# Patient Record
Sex: Female | Born: 1985 | Race: White | Hispanic: No | Marital: Married | Smoking: Current every day smoker
Health system: Southern US, Community
[De-identification: ages and names within clinical notes are randomized; demographics above are authoritative.]

## PROBLEM LIST (undated history)

## (undated) ENCOUNTER — Inpatient Hospital Stay (HOSPITAL_COMMUNITY): Payer: Self-pay

## (undated) DIAGNOSIS — N83209 Unspecified ovarian cyst, unspecified side: Secondary | ICD-10-CM

## (undated) DIAGNOSIS — F32A Depression, unspecified: Secondary | ICD-10-CM

## (undated) DIAGNOSIS — R87629 Unspecified abnormal cytological findings in specimens from vagina: Secondary | ICD-10-CM

## (undated) DIAGNOSIS — F329 Major depressive disorder, single episode, unspecified: Secondary | ICD-10-CM

## (undated) DIAGNOSIS — F1410 Cocaine abuse, uncomplicated: Secondary | ICD-10-CM

## (undated) HISTORY — PX: WISDOM TOOTH EXTRACTION: SHX21

## (undated) HISTORY — PX: CERVICAL CONE BIOPSY: SUR198

---
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
## 2006-07-30 ENCOUNTER — Emergency Department (HOSPITAL_COMMUNITY): Admission: EM | Admit: 2006-07-30 | Discharge: 2006-07-30 | Payer: Self-pay | Admitting: Emergency Medicine

## 2006-09-20 ENCOUNTER — Emergency Department (HOSPITAL_COMMUNITY): Admission: EM | Admit: 2006-09-20 | Discharge: 2006-09-20 | Payer: Self-pay | Admitting: Emergency Medicine

## 2007-01-12 ENCOUNTER — Emergency Department (HOSPITAL_COMMUNITY): Admission: EM | Admit: 2007-01-12 | Discharge: 2007-01-13 | Payer: Self-pay | Admitting: Emergency Medicine

## 2007-01-21 ENCOUNTER — Emergency Department (HOSPITAL_COMMUNITY): Admission: EM | Admit: 2007-01-21 | Discharge: 2007-01-21 | Payer: Self-pay | Admitting: Emergency Medicine

## 2011-01-24 ENCOUNTER — Other Ambulatory Visit (HOSPITAL_COMMUNITY): Payer: Self-pay | Admitting: Preventative Medicine

## 2011-01-24 ENCOUNTER — Emergency Department (HOSPITAL_COMMUNITY)
Admission: EM | Admit: 2011-01-24 | Discharge: 2011-01-24 | Disposition: A | Discharge status: Home / Self Care | Payer: Self-pay | Attending: Emergency Medicine | Admitting: Emergency Medicine

## 2011-01-24 DIAGNOSIS — M25569 Pain in unspecified knee: Secondary | ICD-10-CM

## 2011-01-25 ENCOUNTER — Inpatient Hospital Stay (HOSPITAL_COMMUNITY): Admission: RE | Admit: 2011-01-25 | Payer: Self-pay | Source: Ambulatory Visit

## 2011-05-11 ENCOUNTER — Encounter: Payer: Self-pay | Admitting: *Deleted

## 2011-05-11 ENCOUNTER — Emergency Department (HOSPITAL_COMMUNITY)
Admission: EM | Admit: 2011-05-11 | Discharge: 2011-05-11 | Disposition: A | Discharge status: Home / Self Care | Payer: Self-pay | Attending: Emergency Medicine | Admitting: Emergency Medicine

## 2011-05-11 DIAGNOSIS — F17200 Nicotine dependence, unspecified, uncomplicated: Secondary | ICD-10-CM | POA: Insufficient documentation

## 2011-05-11 DIAGNOSIS — IMO0002 Reserved for concepts with insufficient information to code with codable children: Secondary | ICD-10-CM | POA: Insufficient documentation

## 2011-05-11 DIAGNOSIS — L0291 Cutaneous abscess, unspecified: Secondary | ICD-10-CM

## 2011-05-11 MED ORDER — OXYCODONE-ACETAMINOPHEN 5-325 MG PO TABS
1.0000 | ORAL_TABLET | Freq: Once | ORAL | Status: AC
  Administered 2011-05-11: 1 via ORAL
  Filled 2011-05-11: qty 1

## 2011-05-11 MED ORDER — LIDOCAINE-EPINEPHRINE (PF) 1 %-1:200000 IJ SOLN
INTRAMUSCULAR | Status: AC
  Administered 2011-05-11: 23:00:00
  Filled 2011-05-11: qty 10

## 2011-05-11 MED ORDER — DOXYCYCLINE HYCLATE 100 MG PO CAPS: 100.0000 mg | ORAL_CAPSULE | Freq: Two times a day (BID) | ORAL | Status: AC

## 2011-05-11 MED ORDER — HYDROCODONE-ACETAMINOPHEN 5-325 MG PO TABS: 2.0000 | ORAL_TABLET | ORAL | Status: AC | PRN

## 2011-05-11 NOTE — ED Provider Notes (Signed)
History     CSN: [id number] Arrival date & time: 05/11/2011 10:20 PM  Chief Complaint  Patient presents with  . Recurrent Skin Infections   HPI Comments: Patient states she's noticed a swelling in the right axilla. She also noticed that she has some pain  going to her right breast area. The pain increases with palpation. She denies any fever  Patient is a 25 y.o. female presenting with abscess. The history is provided by the patient.  Abscess  This is a new problem. Episode frequency: 3 days ago. The problem has been gradually worsening. Affected Location: right axilla. The problem is mild. The abscess is characterized by draining. It is unknown what she was exposed to. Pertinent negatives include no anorexia and no fever. There were no sick contacts. She has received no recent medical care.    History reviewed. No pertinent past medical history.  History reviewed. No pertinent past surgical history.  History reviewed. No pertinent family history.  History  Substance Use Topics  . Smoking status: Current Everyday Smoker -- 1.0 packs/day  . Smokeless tobacco: Not on file  . Alcohol Use:      occassional aclohol intake    OB History    Grav Para Term Preterm Abortions TAB SAB Ect Mult Living                  Review of Systems  Constitutional: Negative for fever.  Gastrointestinal: Negative for anorexia.  All other systems reviewed and are negative.    Physical Exam  BP 111/72  Pulse 74  Temp(Src) 98.1 F (36.7 C) (Oral)  Resp 20  Ht 5\' 7"  (1.702 m)  Wt 175 lb (79.379 kg)  BMI 27.41 kg/m2  SpO2 100%  LMP 05/04/2011  Physical Exam  Vitals reviewed. Constitutional: She appears well-developed and well-nourished. No distress.  HENT:  Head: Normocephalic and atraumatic.  Right Ear: External ear normal.  Left Ear: External ear normal.  Eyes: Conjunctivae are normal. Right eye exhibits no discharge. Left eye exhibits no discharge. No scleral icterus.  Neck: Neck  supple. No tracheal deviation present.  Cardiovascular: Normal rate, regular rhythm and intact distal pulses.   Pulmonary/Chest: Effort normal and breath sounds normal. No stridor. No respiratory distress. She has no wheezes. She has no rales.  Abdominal: Soft. Bowel sounds are normal. She exhibits no distension. There is no tenderness. There is no rebound and no guarding.  Musculoskeletal: She exhibits tenderness. She exhibits no edema.  Neurological: She is alert. She has normal strength. No sensory deficit. Cranial nerve deficit:  no gross defecits noted. She exhibits normal muscle tone. She displays no seizure activity. Coordination normal.  Skin: Skin is warm and dry. No rash noted.  Psychiatric: She has a normal mood and affect.    ED Course  INCISION AND DRAINAGE Performed by: Linwood Dibbles R Authorized by: Linwood Dibbles R Consent: Verbal consent obtained. Risks and benefits: risks, benefits and alternatives were discussed Consent given by: patient Patient understanding: patient states understanding of the procedure being performed Type: abscess Location: Right axilla. Anesthesia: local infiltration Local anesthetic: lidocaine 1% with epinephrine Anesthetic total: 4 ml Scalpel size: 11 Incision type: single straight Complexity: simple Drainage characteristics: None. Drainage amount: scant Patient tolerance: Patient tolerated the procedure well with no immediate complications.   . MDM Patient was a small area of induration. No definite abscess drainage. We'll start her on oral antibiotics pain medications and will have her continue the warm compresses.  Celene Kras, MD 05/11/11 [phone]

## 2011-05-11 NOTE — ED Notes (Signed)
Boil under right arm, states it opened up and drained a couple of nights ago

## 2011-06-24 ENCOUNTER — Emergency Department (HOSPITAL_COMMUNITY)
Admission: EM | Admit: 2011-06-24 | Discharge: 2011-06-24 | Disposition: A | Discharge status: Home / Self Care | Payer: Self-pay | Attending: Emergency Medicine | Admitting: Emergency Medicine

## 2011-06-24 ENCOUNTER — Encounter (HOSPITAL_COMMUNITY): Payer: Self-pay | Admitting: *Deleted

## 2011-06-24 DIAGNOSIS — R6883 Chills (without fever): Secondary | ICD-10-CM | POA: Insufficient documentation

## 2011-06-24 DIAGNOSIS — R062 Wheezing: Secondary | ICD-10-CM | POA: Insufficient documentation

## 2011-06-24 DIAGNOSIS — J40 Bronchitis, not specified as acute or chronic: Secondary | ICD-10-CM

## 2011-06-24 DIAGNOSIS — J3489 Other specified disorders of nose and nasal sinuses: Secondary | ICD-10-CM | POA: Insufficient documentation

## 2011-06-24 DIAGNOSIS — F17200 Nicotine dependence, unspecified, uncomplicated: Secondary | ICD-10-CM | POA: Insufficient documentation

## 2011-06-24 DIAGNOSIS — R05 Cough: Secondary | ICD-10-CM | POA: Insufficient documentation

## 2011-06-24 DIAGNOSIS — R059 Cough, unspecified: Secondary | ICD-10-CM | POA: Insufficient documentation

## 2011-06-24 DIAGNOSIS — IMO0001 Reserved for inherently not codable concepts without codable children: Secondary | ICD-10-CM | POA: Insufficient documentation

## 2011-06-24 MED ORDER — GUAIFENESIN-CODEINE 100-10 MG/5ML PO SYRP: 10.0000 mL | ORAL_SOLUTION | Freq: Three times a day (TID) | ORAL | Status: AC | PRN

## 2011-06-24 MED ORDER — ALBUTEROL SULFATE HFA 108 (90 BASE) MCG/ACT IN AERS
2.0000 | INHALATION_SPRAY | Freq: Once | RESPIRATORY_TRACT | Status: AC
  Administered 2011-06-24: 2 via RESPIRATORY_TRACT
  Filled 2011-06-24: qty 6.7

## 2011-06-24 MED ORDER — IPRATROPIUM BROMIDE 0.02 % IN SOLN
0.5000 mg | Freq: Once | RESPIRATORY_TRACT | Status: AC
  Administered 2011-06-24: 0.5 mg via RESPIRATORY_TRACT
  Filled 2011-06-24: qty 2.5

## 2011-06-24 MED ORDER — ALBUTEROL SULFATE (5 MG/ML) 0.5% IN NEBU
5.0000 mg | INHALATION_SOLUTION | Freq: Once | RESPIRATORY_TRACT | Status: AC
  Administered 2011-06-24: 5 mg via RESPIRATORY_TRACT
  Filled 2011-06-24: qty 1

## 2011-06-24 MED ORDER — DOXYCYCLINE HYCLATE 100 MG PO CAPS: 100.0000 mg | ORAL_CAPSULE | Freq: Two times a day (BID) | ORAL | Status: AC

## 2011-06-24 MED ORDER — DOXYCYCLINE HYCLATE 100 MG PO TABS
100.0000 mg | ORAL_TABLET | Freq: Once | ORAL | Status: AC
  Administered 2011-06-24: 100 mg via ORAL
  Filled 2011-06-24: qty 1

## 2011-06-24 NOTE — ED Notes (Signed)
Pt c/o cough, headache, fever and body aches x 2 weeks

## 2011-06-24 NOTE — ED Notes (Signed)
Pt a/ox4. Resp even and unlabored. NAD at this time. D/C instructions and RX x 2 reviewed with pt. Pt verbalized understanding. Pt ambulated to POV with steady gate. Boyfriend with pt to transport home.

## 2011-06-24 NOTE — ED Provider Notes (Signed)
History     CSN: [id number] Arrival date & time: 06/24/2011 12:41 PM  Chief Complaint  Patient presents with  . Nasal Congestion  . Generalized Body Aches  . Fever  . Cough  . Headache    (Consider location/radiation/quality/duration/timing/severity/associated sxs/prior treatment) Patient is a 25 y.o. female presenting with cough. The history is provided by the patient.  Cough This is a new problem. The current episode started more than 1 week ago. The problem occurs every few minutes. The problem has not changed since onset.The cough is non-productive. The maximum temperature recorded prior to her arrival was 100 to 100.9 F. Associated symptoms include chills, rhinorrhea, myalgias and wheezing. Pertinent negatives include no chest pain, no ear congestion, no ear pain, no headaches, no sore throat, no shortness of breath and no eye redness. She has tried decongestants for the symptoms. The treatment provided no relief. She is a smoker. Her past medical history does not include bronchitis, pneumonia or asthma.    History reviewed. No pertinent past medical history.  History reviewed. No pertinent past surgical history.  History reviewed. No pertinent family history.  History  Substance Use Topics  . Smoking status: Current Everyday Smoker -- 1.0 packs/day  . Smokeless tobacco: Not on file  . Alcohol Use: Yes     occassional aclohol intake    OB History    Grav Para Term Preterm Abortions TAB SAB Ect Mult Living                  Review of Systems  Constitutional: Positive for chills.  HENT: Positive for congestion and rhinorrhea. Negative for ear pain, sore throat, neck pain and neck stiffness.   Eyes: Negative for discharge, redness and visual disturbance.  Respiratory: Positive for cough and wheezing. Negative for shortness of breath.   Cardiovascular: Negative for chest pain.  Musculoskeletal: Positive for myalgias.  Neurological: Negative for dizziness, weakness,  numbness and headaches.  Hematological: Does not bruise/bleed easily.  All other systems reviewed and are negative.    Allergies  Other  Home Medications   Current Outpatient Rx  Name Route Sig Dispense Refill  . ACETAMINOPHEN 650 MG PO TBCR Oral Take 650 mg by mouth every 8 (eight) hours as needed. For pain     . NYQUIL COLD & FLU PO Oral Take 1 capsule by mouth daily as needed. FOR COUGH AND COLD SYMPTOMS     . GUAIFENESIN 600 MG PO TB12 Oral Take 1,200 mg by mouth 2 (two) times daily.      . IBUPROFEN 200 MG PO TABS Oral Take 800 mg by mouth 3 (three) times daily as needed. For pain     . PHENYLEPH-CPM-DM-APAP 01-25-09-325 MG PO CAPS Oral Take 1 capsule by mouth daily as needed. For pain     . LEVONORGESTREL 20 MCG/24HR IU IUD Intrauterine 1 each by Intrauterine route once.        BP 115/80  Pulse 75  Temp(Src) 98.1 F (36.7 C) (Oral)  Resp 18  Ht 5\' 7"  (1.702 m)  Wt 175 lb (79.379 kg)  BMI 27.41 kg/m2  SpO2 97%  Physical Exam  Nursing note and vitals reviewed. Constitutional: She appears well-developed and well-nourished. No distress.  HENT:  Head: Normocephalic and atraumatic. No trismus in the jaw.  Nose: Mucosal edema and rhinorrhea present. No sinus tenderness.  Mouth/Throat: Uvula is midline, oropharynx is clear and moist and mucous membranes are normal. No uvula swelling.  Eyes: EOM are normal. Pupils are equal,  round, and reactive to light.  Neck: Normal range of motion. Neck supple.  Cardiovascular: Normal rate, regular rhythm and normal heart sounds.   Pulmonary/Chest: Effort normal. She has no decreased breath sounds. She has wheezes. She has no rales. She exhibits no tenderness.  Musculoskeletal: Normal range of motion.  Lymphadenopathy:    She has no cervical adenopathy.  Neurological: She is alert. No cranial nerve deficit. She exhibits normal muscle tone. Coordination normal.  Skin: Skin is warm and dry.    ED Course  Procedures (including critical  care time)       MDM     3:42 PM patient feeling better.  Few expiratory wheezes still remain.  No rales, hypoxia, tachycardia or tachypnea.  Likley bronchitis.  I will start doxycycline and disp an inhaler for home use.  Patient agrees to return here for worsening sx's  Medical screening examination/treatment/procedure(s) were performed by non-physician practitioner and as supervising physician I was immediately available for consultation/collaboration. Osvaldo Human, M.D.   Tammy L. [place], [place] 06/26/11 1854  Carleene Cooper III, MD 06/26/11 [phone]

## 2015-09-27 NOTE — L&D Delivery Note (Signed)
Delivery Note  Complete dilation at 1520 Onset of pushing at 1525 FHR second stage Category 1  Analgesia /Anesthesia intrapartum: epidural  Delivery of a viable baby girl at 1540 by CNM in OA to LOT position.  Nuchal Cord none. Cord double clamped after cessation of pulsation, cut by FOB.  Cord blood sample collected.  Placenta delivered S/C/I intact with 3 VC.  Placenta to unit for disposal. Uterine tone firm bleeding light  No laceration identified.  Est. Blood Loss (mL): 200  Complications: none APGAR: 9/9 Weight pending Mom to postpartum.  Baby to Couplet care / Skin to Skin.  Neta Mendsaniela C Paul, CNM, MSN 06/08/2016, 4:03 PM

## 2015-11-19 LAB — OB RESULTS CONSOLE HEPATITIS B SURFACE ANTIGEN: Hepatitis B Surface Ag: NEGATIVE

## 2015-11-19 LAB — OB RESULTS CONSOLE GC/CHLAMYDIA
Chlamydia: NEGATIVE
Gonorrhea: NEGATIVE

## 2015-11-19 LAB — OB RESULTS CONSOLE ABO/RH: RH TYPE: NEGATIVE

## 2015-11-19 LAB — OB RESULTS CONSOLE RPR: RPR: NONREACTIVE

## 2015-11-19 LAB — OB RESULTS CONSOLE ANTIBODY SCREEN: ANTIBODY SCREEN: NEGATIVE

## 2015-11-19 LAB — OB RESULTS CONSOLE HIV ANTIBODY (ROUTINE TESTING): HIV: NONREACTIVE

## 2015-11-19 LAB — OB RESULTS CONSOLE RUBELLA ANTIBODY, IGM: Rubella: IMMUNE

## 2016-01-06 DIAGNOSIS — Z36 Encounter for antenatal screening of mother: Secondary | ICD-10-CM | POA: Diagnosis not present

## 2016-01-19 DIAGNOSIS — Z36 Encounter for antenatal screening of mother: Secondary | ICD-10-CM | POA: Diagnosis not present

## 2016-01-19 DIAGNOSIS — Z3482 Encounter for supervision of other normal pregnancy, second trimester: Secondary | ICD-10-CM | POA: Diagnosis not present

## 2016-02-16 DIAGNOSIS — G5601 Carpal tunnel syndrome, right upper limb: Secondary | ICD-10-CM | POA: Diagnosis not present

## 2016-02-16 DIAGNOSIS — Z3482 Encounter for supervision of other normal pregnancy, second trimester: Secondary | ICD-10-CM | POA: Diagnosis not present

## 2016-03-14 DIAGNOSIS — Z36 Encounter for antenatal screening of mother: Secondary | ICD-10-CM | POA: Diagnosis not present

## 2016-04-04 DIAGNOSIS — Z3A29 29 weeks gestation of pregnancy: Secondary | ICD-10-CM | POA: Diagnosis not present

## 2016-04-04 DIAGNOSIS — Z23 Encounter for immunization: Secondary | ICD-10-CM | POA: Diagnosis not present

## 2016-04-04 DIAGNOSIS — Z36 Encounter for antenatal screening of mother: Secondary | ICD-10-CM | POA: Diagnosis not present

## 2016-04-04 DIAGNOSIS — O360110 Maternal care for anti-D [Rh] antibodies, first trimester, not applicable or unspecified: Secondary | ICD-10-CM | POA: Diagnosis not present

## 2016-04-04 DIAGNOSIS — Z3483 Encounter for supervision of other normal pregnancy, third trimester: Secondary | ICD-10-CM | POA: Diagnosis not present

## 2016-04-23 ENCOUNTER — Inpatient Hospital Stay (HOSPITAL_COMMUNITY)
Admission: AD | Admit: 2016-04-23 | Discharge: 2016-04-23 | Disposition: A | Discharge status: Home / Self Care | Payer: BLUE CROSS/BLUE SHIELD | Source: Ambulatory Visit | Attending: Obstetrics and Gynecology | Admitting: Obstetrics and Gynecology

## 2016-04-23 ENCOUNTER — Encounter (HOSPITAL_COMMUNITY): Payer: Self-pay | Admitting: *Deleted

## 2016-04-23 DIAGNOSIS — O368130 Decreased fetal movements, third trimester, not applicable or unspecified: Secondary | ICD-10-CM

## 2016-04-23 DIAGNOSIS — O99333 Smoking (tobacco) complicating pregnancy, third trimester: Secondary | ICD-10-CM | POA: Diagnosis not present

## 2016-04-23 DIAGNOSIS — N898 Other specified noninflammatory disorders of vagina: Secondary | ICD-10-CM | POA: Diagnosis not present

## 2016-04-23 DIAGNOSIS — F17200 Nicotine dependence, unspecified, uncomplicated: Secondary | ICD-10-CM | POA: Diagnosis not present

## 2016-04-23 DIAGNOSIS — Z3A32 32 weeks gestation of pregnancy: Secondary | ICD-10-CM | POA: Insufficient documentation

## 2016-04-23 DIAGNOSIS — O26893 Other specified pregnancy related conditions, third trimester: Secondary | ICD-10-CM

## 2016-04-23 HISTORY — DX: Depression, unspecified: F32.A

## 2016-04-23 HISTORY — DX: Cocaine abuse, uncomplicated: F14.10

## 2016-04-23 HISTORY — DX: Major depressive disorder, single episode, unspecified: F32.9

## 2016-04-23 HISTORY — DX: Unspecified abnormal cytological findings in specimens from vagina: R87.629

## 2016-04-23 LAB — WET PREP, GENITAL
CLUE CELLS WET PREP: NONE SEEN
SPERM: NONE SEEN
Trich, Wet Prep: NONE SEEN
YEAST WET PREP: NONE SEEN

## 2016-04-23 LAB — URINALYSIS, ROUTINE W REFLEX MICROSCOPIC
Bilirubin Urine: NEGATIVE
Glucose, UA: NEGATIVE mg/dL
HGB URINE DIPSTICK: NEGATIVE
Ketones, ur: NEGATIVE mg/dL
Leukocytes, UA: NEGATIVE
Nitrite: NEGATIVE
Protein, ur: NEGATIVE mg/dL
SPECIFIC GRAVITY, URINE: 1.025 (ref 1.005–1.030)
pH: 5.5 (ref 5.0–8.0)

## 2016-04-23 NOTE — MAU Note (Signed)
States has not felt FM since this AM. C/O passing a lot of white watery discharge.

## 2016-04-23 NOTE — Discharge Instructions (Signed)
Braxton Hicks Contractions °Contractions of the uterus can occur throughout pregnancy. Contractions are not always a sign that you are in labor.  °WHAT ARE BRAXTON HICKS CONTRACTIONS?  °Contractions that occur before labor are called Braxton Hicks contractions, or false labor. Toward the end of pregnancy (32-34 weeks), these contractions can develop more often and may become more forceful. This is not true labor because these contractions do not result in opening (dilatation) and thinning of the cervix. They are sometimes difficult to tell apart from true labor because these contractions can be forceful and people have different pain tolerances. You should not feel embarrassed if you go to the hospital with false labor. Sometimes, the only way to tell if you are in true labor is for your health care provider to look for changes in the cervix. °If there are no prenatal problems or other health problems associated with the pregnancy, it is completely safe to be sent home with false labor and await the onset of true labor. °HOW CAN YOU TELL THE DIFFERENCE BETWEEN TRUE AND FALSE LABOR? °False Labor °· The contractions of false labor are usually shorter and not as hard as those of true labor.   °· The contractions are usually irregular.   °· The contractions are often felt in the front of the lower abdomen and in the groin.   °· The contractions may go away when you walk around or change positions while lying down.   °· The contractions get weaker and are shorter lasting as time goes on.   °· The contractions do not usually become progressively stronger, regular, and closer together as with true labor.   °True Labor °· Contractions in true labor last 30-70 seconds, become very regular, usually become more intense, and increase in frequency.   °· The contractions do not go away with walking.   °· The discomfort is usually felt in the top of the uterus and spreads to the lower abdomen and low back.   °· True labor can be  determined by your health care provider with an exam. This will show that the cervix is dilating and getting thinner.   °WHAT TO REMEMBER °· Keep up with your usual exercises and follow other instructions given by your health care provider.   °· Take medicines as directed by your health care provider.   °· Keep your regular prenatal appointments.   °· Eat and drink lightly if you think you are going into labor.   °· If Braxton Hicks contractions are making you uncomfortable:   °¨ Change your position from lying down or resting to walking, or from walking to resting.   °¨ Sit and rest in a tub of warm water.   °¨ Drink 2-3 glasses of water. Dehydration may cause these contractions.   °¨ Do slow and deep breathing several times an hour.   °WHEN SHOULD I SEEK IMMEDIATE MEDICAL CARE? °Seek immediate medical care if: °· Your contractions become stronger, more regular, and closer together.   °· You have fluid leaking or gushing from your vagina.   °· You have a fever.   °· You pass blood-tinged mucus.   °· You have vaginal bleeding.   °· You have continuous abdominal pain.   °· You have low back pain that you never had before.   °· You feel your baby's head pushing down and causing pelvic pressure.   °· Your baby is not moving as much as it used to.   °  °This information is not intended to replace advice given to you by your health care provider. Make sure you discuss any questions you have with your health care   provider. °  °Document Released: 09/12/2005 Document Revised: 09/17/2013 Document Reviewed: 06/24/2013 °Elsevier Interactive Patient Education ©2016 [employer] ° °

## 2016-04-23 NOTE — MAU Provider Note (Signed)
Chief Complaint:  Decreased Fetal Movement and Vaginal Discharge   First Provider Initiated Contact with Patient 04/23/16 1519     HPI: Carolyn Roach is a 30 y.o. G4P1021 at [redacted]w[redacted]d who presents to maternity admissions reporting one episode of leaking fluid at work this morning and decreased fetal movement. She leaking of fluid to make her underpants dampening when slightly through to her underpants. No further leaking since then. Denies contractions, vaginal bleeding.  Context: While stepping over something Timing: once Associated signs and symptoms: Negative for fever, chills, abdominal pain, abdominal tenderness, vaginal discharge or vaginal bleeding.  Past Medical History: Past Medical History:  Diagnosis Date  . Cocaine abuse    8 years ago, went to rehab  . Depression   . Vaginal Pap smear, abnormal     Past obstetric history: OB History  Gravida Para Term Preterm AB Living  SAB TAB Ectopic Multiple Live Births  1 1          # Outcome Date GA Lbr Len/2nd Weight Sex Delivery Anes PTL Lv  4 Current           3 TAB           2 SAB           1 Term               Past Surgical History: Past Surgical History:  Procedure Laterality Date  . CERVICAL CONE BIOPSY    . WISDOM TOOTH EXTRACTION       Family History: History reviewed. No pertinent family history.  Social History: Social History  Substance Use Topics  . Smoking status: Current Every Day Smoker    Packs/day: 1.00  . Smokeless tobacco: Never Used  . Alcohol use Yes     Comment: occassional aclohol intake    Allergies:  Allergies  Allergen Reactions  . Other Anaphylaxis and Other (See Comments)    Pt states that she is allergic to curry.      Meds:  Prescriptions Prior to Admission  Medication Sig Dispense Refill Last Dose  . acetaminophen (TYLENOL) 325 MG tablet Take 325 mg by mouth every 6 (six) hours as needed for mild pain, moderate pain or headache.   Past Week at Unknown time  .  Prenatal Vit-Fe Fumarate-FA (PRENATAL MULTIVITAMIN) TABS tablet Take 1 tablet by mouth daily.   Past Month at Unknown time    I have reviewed patient's Past Medical Hx, Surgical Hx, Family Hx, Social Hx, medications and allergies.   ROS:  Review of Systems  Constitutional: Negative for chills and fever.  Gastrointestinal: Negative for abdominal pain.  Endocrine: Negative for polyuria.  Genitourinary: Positive for vaginal discharge. Negative for dysuria, urgency and vaginal bleeding.    Physical Exam  Patient Vitals for the past 24 hrs:  BP Temp Temp src Pulse Resp  04/23/16 1348 115/77 98.2 F (36.8 C) Oral 98 18   Constitutional: Well-developed, well-nourished female in no acute distress.  Cardiovascular: normal rate Respiratory: normal effort GI: Abd soft, non-tender, gravid appropriate for gestational age.  MS: Extremities nontender, no edema, normal ROM Neurologic: Alert and oriented x 4.  GU: Neg CVAT.  Pelvic: NEFG, small amount of mucoid discharge, pooling, no blood, cervix clean, visually long and closed. No CMT    FHT:  Baseline 140 , moderate variability, 15x15 accelerations present, no decelerations Contractions: none   Labs: Results for orders placed or performed during the hospital  encounter of 04/23/16 (from the past 24 hour(s))  Urinalysis, Routine w reflex microscopic (not at [facility])     Status: None   Collection Time: 04/23/16  1:35 PM  Result Value Ref Range   Color, Urine YELLOW YELLOW   APPearance CLEAR CLEAR   Specific Gravity, Urine 1.025 1.005 - 1.030   pH 5.5 5.0 - 8.0   Glucose, UA NEGATIVE NEGATIVE mg/dL   Hgb urine dipstick NEGATIVE NEGATIVE   Bilirubin Urine NEGATIVE NEGATIVE   Ketones, ur NEGATIVE NEGATIVE mg/dL   Protein, ur NEGATIVE NEGATIVE mg/dL   Nitrite NEGATIVE NEGATIVE   Leukocytes, UA NEGATIVE NEGATIVE  Wet prep, genital     Status: Abnormal   Collection Time: 04/23/16  3:43 PM  Result Value Ref Range   Yeast Wet Prep HPF POC NONE  SEEN NONE SEEN   Trich, Wet Prep NONE SEEN NONE SEEN   Clue Cells Wet Prep HPF POC NONE SEEN NONE SEEN   WBC, Wet Prep HPF POC FEW (A) NONE SEEN   Sperm NONE SEEN    Negative fern  Imaging:  NA  MAU Course: Orders Placed This Encounter  Procedures  . Wet prep, genital  . Urinalysis, Routine w reflex microscopic (not at [facility])   Discussed Hx, exam labs w/ Dr. Billy Coast. OK for D/C  MDM: - 30 year old female 32 weeks and 3 days with episode of leaking fluid most likely from passage of cervical mucus. No evidence of ruptured membranes.  - Decreased fetal movement resolved. Patient reports normal fetal movement while in maternity admissions NST reactive.  Assessment: 1. Vaginal discharge during pregnancy in third trimester   2. Decreased fetal movement, third trimester, not applicable or unspecified fetus     Plan: Discharge home in stable condition.  Preterm labor precautions and fetal kick counts Follow-up Information    LAVOIE,MARIE-LYNE, MD Follow up on 05/03/2016.   Specialty:  Obstetrics and Gynecology Why:  As scheduled or sooner as needed if symptoms worsen Contact information: [address] [place] [address] [phone]        THE [facility] OF [place] MATERNITY ADMISSIONS .   Why:  As needed if symptoms worsen Contact information: [address] [id number] mc [place] [address] [phone]            Medication List    TAKE these medications   acetaminophen 325 MG tablet Commonly known as:  TYLENOL Take 325 mg by mouth every 6 (six) hours as needed for mild pain, moderate pain or headache.   prenatal multivitamin Tabs tablet Take 1 tablet by mouth daily.       [place], CNM 04/23/2016 4:12 PM

## 2016-04-25 LAB — GC/CHLAMYDIA PROBE AMP (~~LOC~~) NOT AT ARMC
Chlamydia: NEGATIVE
Neisseria Gonorrhea: NEGATIVE

## 2016-05-03 DIAGNOSIS — O3663X0 Maternal care for excessive fetal growth, third trimester, not applicable or unspecified: Secondary | ICD-10-CM | POA: Diagnosis not present

## 2016-05-03 DIAGNOSIS — Z3A33 33 weeks gestation of pregnancy: Secondary | ICD-10-CM | POA: Diagnosis not present

## 2016-05-17 DIAGNOSIS — N898 Other specified noninflammatory disorders of vagina: Secondary | ICD-10-CM | POA: Diagnosis not present

## 2016-05-17 DIAGNOSIS — Z3483 Encounter for supervision of other normal pregnancy, third trimester: Secondary | ICD-10-CM | POA: Diagnosis not present

## 2016-05-17 DIAGNOSIS — Z36 Encounter for antenatal screening of mother: Secondary | ICD-10-CM | POA: Diagnosis not present

## 2016-05-17 LAB — OB RESULTS CONSOLE GBS: STREP GROUP B AG: POSITIVE

## 2016-05-30 DIAGNOSIS — Z6831 Body mass index (BMI) 31.0-31.9, adult: Secondary | ICD-10-CM | POA: Diagnosis not present

## 2016-05-30 DIAGNOSIS — K1120 Sialoadenitis, unspecified: Secondary | ICD-10-CM | POA: Diagnosis not present

## 2016-06-02 ENCOUNTER — Other Ambulatory Visit: Payer: Self-pay | Admitting: Obstetrics

## 2016-06-02 DIAGNOSIS — K1120 Sialoadenitis, unspecified: Secondary | ICD-10-CM | POA: Diagnosis not present

## 2016-06-02 DIAGNOSIS — Z6830 Body mass index (BMI) 30.0-30.9, adult: Secondary | ICD-10-CM | POA: Diagnosis not present

## 2016-06-06 ENCOUNTER — Telehealth (HOSPITAL_COMMUNITY): Payer: Self-pay | Admitting: *Deleted

## 2016-06-06 ENCOUNTER — Encounter (HOSPITAL_COMMUNITY): Payer: Self-pay | Admitting: *Deleted

## 2016-06-06 NOTE — Telephone Encounter (Signed)
Preadmission screen  

## 2016-06-08 ENCOUNTER — Encounter (HOSPITAL_COMMUNITY): Payer: Self-pay

## 2016-06-08 ENCOUNTER — Inpatient Hospital Stay (HOSPITAL_COMMUNITY)
Admission: RE | Admit: 2016-06-08 | Discharge: 2016-06-09 | DRG: 775 | Disposition: A | Discharge status: Home / Self Care | Payer: BLUE CROSS/BLUE SHIELD | Source: Ambulatory Visit | Attending: Obstetrics | Admitting: Obstetrics

## 2016-06-08 ENCOUNTER — Inpatient Hospital Stay (HOSPITAL_COMMUNITY): Payer: BLUE CROSS/BLUE SHIELD | Admitting: Anesthesiology

## 2016-06-08 DIAGNOSIS — O99334 Smoking (tobacco) complicating childbirth: Secondary | ICD-10-CM | POA: Diagnosis not present

## 2016-06-08 DIAGNOSIS — O99824 Streptococcus B carrier state complicating childbirth: Principal | ICD-10-CM | POA: Diagnosis present

## 2016-06-08 DIAGNOSIS — Z833 Family history of diabetes mellitus: Secondary | ICD-10-CM | POA: Diagnosis not present

## 2016-06-08 DIAGNOSIS — O3443 Maternal care for other abnormalities of cervix, third trimester: Secondary | ICD-10-CM | POA: Diagnosis present

## 2016-06-08 DIAGNOSIS — Z8249 Family history of ischemic heart disease and other diseases of the circulatory system: Secondary | ICD-10-CM

## 2016-06-08 DIAGNOSIS — Z3483 Encounter for supervision of other normal pregnancy, third trimester: Secondary | ICD-10-CM | POA: Diagnosis not present

## 2016-06-08 DIAGNOSIS — N882 Stricture and stenosis of cervix uteri: Secondary | ICD-10-CM | POA: Diagnosis not present

## 2016-06-08 DIAGNOSIS — Z3490 Encounter for supervision of normal pregnancy, unspecified, unspecified trimester: Secondary | ICD-10-CM

## 2016-06-08 DIAGNOSIS — F17210 Nicotine dependence, cigarettes, uncomplicated: Secondary | ICD-10-CM | POA: Diagnosis present

## 2016-06-08 DIAGNOSIS — Z3A39 39 weeks gestation of pregnancy: Secondary | ICD-10-CM | POA: Diagnosis not present

## 2016-06-08 DIAGNOSIS — Z23 Encounter for immunization: Secondary | ICD-10-CM | POA: Diagnosis not present

## 2016-06-08 LAB — CBC
HEMATOCRIT: 32.3 % — AB (ref 36.0–46.0)
HEMOGLOBIN: 11.4 g/dL — AB (ref 12.0–15.0)
MCH: 32.9 pg (ref 26.0–34.0)
MCHC: 35.3 g/dL (ref 30.0–36.0)
MCV: 93.4 fL (ref 78.0–100.0)
Platelets: 281 10*3/uL (ref 150–400)
RBC: 3.46 MIL/uL — AB (ref 3.87–5.11)
RDW: 13.1 % (ref 11.5–15.5)
WBC: 16.9 10*3/uL — AB (ref 4.0–10.5)

## 2016-06-08 LAB — RPR: RPR: NONREACTIVE

## 2016-06-08 MED ORDER — LACTATED RINGERS IV SOLN
500.0000 mL | INTRAVENOUS | Status: DC | PRN
Start: 2016-06-08 — End: 2016-06-08

## 2016-06-08 MED ORDER — DIBUCAINE 1 % RE OINT: 1.0000 "application " | TOPICAL_OINTMENT | RECTAL | Status: DC | PRN

## 2016-06-08 MED ORDER — BISACODYL 10 MG RE SUPP: 10.0000 mg | Freq: Every day | RECTAL | Status: DC | PRN

## 2016-06-08 MED ORDER — OXYCODONE-ACETAMINOPHEN 5-325 MG PO TABS: 2.0000 | ORAL_TABLET | ORAL | Status: DC | PRN

## 2016-06-08 MED ORDER — WITCH HAZEL-GLYCERIN EX PADS: 1.0000 "application " | MEDICATED_PAD | CUTANEOUS | Status: DC | PRN

## 2016-06-08 MED ORDER — PENICILLIN G POTASSIUM 5000000 UNITS IJ SOLR
5.0000 10*6.[IU] | Freq: Once | INTRAVENOUS | Status: AC
  Administered 2016-06-08: 5 10*6.[IU] via INTRAVENOUS
  Filled 2016-06-08: qty 5

## 2016-06-08 MED ORDER — RISAQUAD PO CAPS
1.0000 | ORAL_CAPSULE | Freq: Three times a day (TID) | ORAL | Status: DC
  Administered 2016-06-08 – 2016-06-09 (×2): 1 via ORAL
  Filled 2016-06-08 (×5): qty 1

## 2016-06-08 MED ORDER — DIPHENHYDRAMINE HCL 25 MG PO CAPS: 25.0000 mg | ORAL_CAPSULE | Freq: Four times a day (QID) | ORAL | Status: DC | PRN

## 2016-06-08 MED ORDER — FENTANYL 2.5 MCG/ML BUPIVACAINE 1/10 % EPIDURAL INFUSION (WH - ANES)
14.0000 mL/h | INTRAMUSCULAR | Status: DC | PRN
  Administered 2016-06-08 (×2): 14 mL/h via EPIDURAL
  Filled 2016-06-08: qty 125

## 2016-06-08 MED ORDER — ONDANSETRON HCL 4 MG/2ML IJ SOLN: 4.0000 mg | Freq: Four times a day (QID) | INTRAMUSCULAR | Status: DC | PRN

## 2016-06-08 MED ORDER — COCONUT OIL OIL: 1.0000 "application " | TOPICAL_OIL | Status: DC | PRN

## 2016-06-08 MED ORDER — SENNOSIDES-DOCUSATE SODIUM 8.6-50 MG PO TABS
2.0000 | ORAL_TABLET | ORAL | Status: DC
  Administered 2016-06-08: 2 via ORAL
  Filled 2016-06-08: qty 2

## 2016-06-08 MED ORDER — OXYTOCIN 40 UNITS IN LACTATED RINGERS INFUSION - SIMPLE MED: 2.5000 [IU]/h | INTRAVENOUS | Status: DC

## 2016-06-08 MED ORDER — OXYTOCIN 40 UNITS IN LACTATED RINGERS INFUSION - SIMPLE MED
INTRAVENOUS | Status: AC
  Administered 2016-06-08: 2 m[IU]/min via INTRAVENOUS
  Filled 2016-06-08: qty 1000

## 2016-06-08 MED ORDER — TERBUTALINE SULFATE 1 MG/ML IJ SOLN
0.2500 mg | Freq: Once | INTRAMUSCULAR | Status: DC | PRN
  Filled 2016-06-08: qty 1

## 2016-06-08 MED ORDER — PRENATAL MULTIVITAMIN CH
1.0000 | ORAL_TABLET | Freq: Every day | ORAL | Status: DC
  Administered 2016-06-09: 1 via ORAL
  Filled 2016-06-08: qty 1

## 2016-06-08 MED ORDER — TETANUS-DIPHTH-ACELL PERTUSSIS 5-2.5-18.5 LF-MCG/0.5 IM SUSP: 0.5000 mL | Freq: Once | INTRAMUSCULAR | Status: DC

## 2016-06-08 MED ORDER — LACTATED RINGERS IV SOLN
INTRAVENOUS | Status: DC
  Administered 2016-06-08: 08:00:00 via INTRAVENOUS

## 2016-06-08 MED ORDER — ACETAMINOPHEN 325 MG PO TABS: 650.0000 mg | ORAL_TABLET | ORAL | Status: DC | PRN

## 2016-06-08 MED ORDER — PENICILLIN G POTASSIUM 5000000 UNITS IJ SOLR: 5.0000 10*6.[IU] | Freq: Once | INTRAVENOUS | Status: DC

## 2016-06-08 MED ORDER — ACETAMINOPHEN 325 MG PO TABS: 650.0000 mg | ORAL_TABLET | Freq: Four times a day (QID) | ORAL | Status: DC | PRN

## 2016-06-08 MED ORDER — NICOTINE 21 MG/24HR TD PT24
21.0000 mg | MEDICATED_PATCH | Freq: Every day | TRANSDERMAL | Status: DC
  Administered 2016-06-09: 21 mg via TRANSDERMAL
  Filled 2016-06-08 (×3): qty 1

## 2016-06-08 MED ORDER — PENICILLIN G POTASSIUM 5000000 UNITS IJ SOLR: 2.5000 10*6.[IU] | INTRAVENOUS | Status: DC

## 2016-06-08 MED ORDER — SOD CITRATE-CITRIC ACID 500-334 MG/5ML PO SOLN: 30.0000 mL | ORAL | Status: DC | PRN

## 2016-06-08 MED ORDER — PHENYLEPHRINE 40 MCG/ML (10ML) SYRINGE FOR IV PUSH (FOR BLOOD PRESSURE SUPPORT)
80.0000 ug | PREFILLED_SYRINGE | INTRAVENOUS | Status: DC | PRN
Start: 2016-06-08 — End: 2016-06-08
  Filled 2016-06-08: qty 5

## 2016-06-08 MED ORDER — ONDANSETRON HCL 4 MG/2ML IJ SOLN: 4.0000 mg | INTRAMUSCULAR | Status: DC | PRN

## 2016-06-08 MED ORDER — IBUPROFEN 600 MG PO TABS
600.0000 mg | ORAL_TABLET | Freq: Four times a day (QID) | ORAL | Status: DC
  Administered 2016-06-08 – 2016-06-09 (×3): 600 mg via ORAL
  Filled 2016-06-08 (×3): qty 1

## 2016-06-08 MED ORDER — OXYTOCIN 40 UNITS IN LACTATED RINGERS INFUSION - SIMPLE MED
1.0000 m[IU]/min | INTRAVENOUS | Status: DC
  Administered 2016-06-08: 2 m[IU]/min via INTRAVENOUS

## 2016-06-08 MED ORDER — DIPHENHYDRAMINE HCL 50 MG/ML IJ SOLN
12.5000 mg | INTRAMUSCULAR | Status: DC | PRN
Start: 2016-06-08 — End: 2016-06-08

## 2016-06-08 MED ORDER — LIDOCAINE HCL (PF) 1 % IJ SOLN
30.0000 mL | INTRAMUSCULAR | Status: DC | PRN
Start: 2016-06-08 — End: 2016-06-08
  Filled 2016-06-08: qty 30

## 2016-06-08 MED ORDER — ONDANSETRON HCL 4 MG PO TABS: 4.0000 mg | ORAL_TABLET | ORAL | Status: DC | PRN

## 2016-06-08 MED ORDER — OXYTOCIN BOLUS FROM INFUSION
500.0000 mL | Freq: Once | INTRAVENOUS | Status: AC
  Administered 2016-06-08: 500 mL via INTRAVENOUS

## 2016-06-08 MED ORDER — LIDOCAINE HCL (PF) 1 % IJ SOLN
INTRAMUSCULAR | Status: DC | PRN
  Administered 2016-06-08: 8 mL via EPIDURAL
  Administered 2016-06-08: 6 mL via EPIDURAL

## 2016-06-08 MED ORDER — EPHEDRINE 5 MG/ML INJ
10.0000 mg | INTRAVENOUS | Status: DC | PRN
Start: 2016-06-08 — End: 2016-06-08
  Filled 2016-06-08: qty 4

## 2016-06-08 MED ORDER — BENZOCAINE-MENTHOL 20-0.5 % EX AERO
1.0000 "application " | INHALATION_SPRAY | CUTANEOUS | Status: DC | PRN
  Administered 2016-06-08: 1 via TOPICAL
  Filled 2016-06-08: qty 56

## 2016-06-08 MED ORDER — LACTATED RINGERS IV SOLN
500.0000 mL | Freq: Once | INTRAVENOUS | Status: AC
  Administered 2016-06-08: 500 mL via INTRAVENOUS

## 2016-06-08 MED ORDER — PENICILLIN G POTASSIUM 5000000 UNITS IJ SOLR
2.5000 10*6.[IU] | INTRAVENOUS | Status: DC
  Administered 2016-06-08 (×3): 2.5 10*6.[IU] via INTRAVENOUS
  Filled 2016-06-08 (×6): qty 2.5

## 2016-06-08 MED ORDER — OXYCODONE-ACETAMINOPHEN 5-325 MG PO TABS: 1.0000 | ORAL_TABLET | ORAL | Status: DC | PRN

## 2016-06-08 MED ORDER — EPHEDRINE 5 MG/ML INJ
10.0000 mg | INTRAVENOUS | Status: DC | PRN
  Filled 2016-06-08: qty 4

## 2016-06-08 MED ORDER — FLEET ENEMA 7-19 GM/118ML RE ENEM: 1.0000 | ENEMA | Freq: Every day | RECTAL | Status: DC | PRN

## 2016-06-08 MED ORDER — PHENYLEPHRINE 40 MCG/ML (10ML) SYRINGE FOR IV PUSH (FOR BLOOD PRESSURE SUPPORT)
80.0000 ug | PREFILLED_SYRINGE | INTRAVENOUS | Status: DC | PRN
  Filled 2016-06-08: qty 5
  Filled 2016-06-08: qty 10

## 2016-06-08 MED ORDER — SIMETHICONE 80 MG PO CHEW: 80.0000 mg | CHEWABLE_TABLET | ORAL | Status: DC | PRN

## 2016-06-08 NOTE — Anesthesia Procedure Notes (Signed)
Epidural Patient location during procedure: OB Start time: 06/08/2016 9:43 AM End time: 06/08/2016 9:47 AM  Staffing Anesthesiologist: Leilani AbleHATCHETT, Khalin Royce Performed: anesthesiologist   Preanesthetic Checklist Completed: patient identified, surgical consent, pre-op evaluation, timeout performed, IV checked, risks and benefits discussed and monitors and equipment checked  Epidural Patient position: sitting Prep: site prepped and draped and DuraPrep Patient monitoring: continuous pulse ox and blood pressure Approach: midline Location: L3-L4 Injection technique: LOR air  Needle:  Needle type: Tuohy  Needle gauge: 17 G Needle length: 9 cm and 9 Needle insertion depth: 5 cm cm Catheter type: closed end flexible Catheter size: 19 Gauge Catheter at skin depth: 10 cm Test dose: negative and Other  Assessment Sensory level: T9 Events: blood not aspirated, injection not painful, no injection resistance, negative IV test and no paresthesia  Additional Notes Reason for block:procedure for pain

## 2016-06-08 NOTE — Progress Notes (Signed)
Patient ID: Carolyn Roach, female   DOB: 02/05/1986, 30 y.o.   MRN: [id number]   [facility]  S: Doing well, pain controlled w/  Epidural,  pelvic pressure present.    O: Vitals:   06/08/16 1300 06/08/16 1330 06/08/16 1400 06/08/16 1430  BP: (!) 90/46 109/70 119/70   Pulse: 78 84 75   Resp: 16 18 18 16   Temp:   98 F (36.7 C)   TempSrc:   Oral   SpO2:      Weight:      Height:         FHT:  FHR: 130 bpm, variability: moderate,  accelerations:  Present,  decelerations:  Absent UC:   regular, every 2 minutes SVE:   Dilation: 10 Effacement (%): 100 Station: +2 Exam by:: Danielle, CNM   A / P: Induction of labor due to elective,  progressing well on pitocin  Fetal Wellbeing:  Category I Pain Control:  Epidural  Anticipated MOD:  NSVD   Dr. Ernestina PennaFogleman updated w/ patient status.  Will proceed w/ delivery, MD unable to attend.   Neta Mendsaniela C Paul, CNM, MSN 06/08/2016, 3:15 PM

## 2016-06-08 NOTE — Anesthesia Postprocedure Evaluation (Signed)
Anesthesia Post Note  Patient: Carolyn BatonHannah Roach  Procedure(s) Performed: * No procedures listed *  Patient location during evaluation: Mother Baby Anesthesia Type: Epidural Level of consciousness: awake and alert Pain management: satisfactory to patient Vital Signs Assessment: post-procedure vital signs reviewed and stable Respiratory status: respiratory function stable Cardiovascular status: stable Postop Assessment: no headache, no backache, epidural receding, patient able to bend at knees, no signs of nausea or vomiting and adequate PO intake Anesthetic complications: no     Last Vitals:  Vitals:   06/08/16 1800 06/08/16 1900  BP: 108/70 121/63  Pulse: 78 65  Resp: 17 18  Temp: 36.6 C 36.8 C    Last Pain:  Vitals:   06/08/16 2012  TempSrc:   PainSc: 5    Pain Goal:                 Carolyn Roach

## 2016-06-08 NOTE — Lactation Note (Addendum)
This note was copied from a baby's chart. Lactation Consultation Note  Patient Name: Carolyn Lexine BatonHannah Bains Today's Date: 06/08/2016 Reason for consult: Initial assessment   Initial consult with mom in Birthing Suites at < 1 hour of age. Infant was cueing to feed and mom reports she has latched briefly prior to this feeding. She latched eagerly to left breast in cradle hold. She was noted to have flanged lips and rhythmic suckling. She was noted to have intermittent swallows. Enc mom to BF 8-12 x in 24 hours at first feeding cues for as long as infant wants.   Mom with large compressible breasts and everted nipples. Discussed BF basics and pillow support with mom. Enc mom to compress/massage breast with feeding. Mom voiced understanding.  [facility] given, mom was informed of IP/OP Services, BF Support Groups and LC phone #. Enc mom to call with questions/concerns prn. Mom is planning to call insurance company for breast pump.    Maternal Data Formula Feeding for Exclusion: No Has patient been taught Hand Expression?: No Does the patient have breastfeeding experience prior to this delivery?: Yes  Feeding Feeding Type: Breast Fed  LATCH Score/Interventions Latch: Grasps breast easily, tongue down, lips flanged, rhythmical sucking.  Audible Swallowing: A few with stimulation Intervention(s): Skin to skin;Hand expression;Alternate breast massage  Type of Nipple: Everted at rest and after stimulation  Comfort (Breast/Nipple): Soft / non-tender     Hold (Positioning): Assistance needed to correctly position infant at breast and maintain latch. Intervention(s): Breastfeeding basics reviewed;Support Pillows;Position options;Skin to skin  LATCH Score: 8  Lactation Tools Discussed/Used WIC Program: No   Consult Status Consult Status: Follow-up Date: 06/09/16 Follow-up type: In-patient    Silas FloodSharon S Syrena Burges 06/08/2016, 4:32 PM

## 2016-06-08 NOTE — Progress Notes (Signed)
S: Doing well, no complaints, pain well controlled with epidural  O: BP 92/64   Pulse 74   Temp 97.6 F (36.4 C) (Axillary)   Resp 16   Ht 5\' 7"  (1.702 m)   Wt 90.7 kg (200 lb)   SpO2 100%   BMI 31.32 kg/m    FHT:  FHR: 125s bpm, variability: moderate,  accelerations:  Present,  decelerations:  Absent UC:   Not tracing well SVE:   Dilation: 5 Effacement (%): 90 Station: 0 Exam by:: Money Mckeithan  AROM forebag, clear   A / P:  29 y.o.  Obstetric History   G4   P1   T1   P0   A2   L1    SAB1   TAB0   Ectopic0   Multiple0   Live Births1    at 8337w0d Elective IOL at term. Slower than expected change but now in active labor and cervical stenosis cleared  Fetal Wellbeing:  Category I Pain Control:  Epidural  Anticipated MOD:  NSVD  Hanley Rispoli A. 06/08/2016, 12:09 PM

## 2016-06-08 NOTE — Anesthesia Preprocedure Evaluation (Signed)
Anesthesia Evaluation  Patient identified by MRN, date of birth, ID band Patient awake    Reviewed: Allergy & Precautions, H&P , NPO status , Patient's Chart, lab work & pertinent test results  Airway Mallampati: I  TM Distance: >3 FB Neck ROM: full    Dental no notable dental hx.    Pulmonary neg pulmonary ROS, Current Smoker,    Pulmonary exam normal        Cardiovascular negative cardio ROS Normal cardiovascular exam     Neuro/Psych negative neurological ROS     GI/Hepatic negative GI ROS, Neg liver ROS,   Endo/Other  negative endocrine ROS  Renal/GU negative Renal ROS     Musculoskeletal   Abdominal (+) + obese,   Peds  Hematology negative hematology ROS (+)   Anesthesia Other Findings   Reproductive/Obstetrics (+) Pregnancy                             Anesthesia Physical Anesthesia Plan  ASA: II  Anesthesia Plan: Epidural   Post-op Pain Management:    Induction:   Airway Management Planned:   Additional Equipment:   Intra-op Plan:   Post-operative Plan:   Informed Consent: I have reviewed the patients History and Physical, chart, labs and discussed the procedure including the risks, benefits and alternatives for the proposed anesthesia with the patient or authorized representative who has indicated his/her understanding and acceptance.     Plan Discussed with:   Anesthesia Plan Comments:         Anesthesia Quick Evaluation

## 2016-06-08 NOTE — H&P (Signed)
Lexine BatonHannah Colasurdo is a 30 y.o. [id number] at 6656w0d presenting for elective induction of labor at term. Pt notes no contractions. Good fetal movement, No vaginal bleeding, not leaking fluid but does note losing her mucous plug.  [facility] since first trimester - Dated by early ultrasound - Prior cold knife cone, subsequent Paps have been normal. Normal cervical length throughout pregnancy - 60 pound weight gain in pregnancy - History of drug abuse and plan to avoid narcotics - Fetal growth. AGA at 33 weeks - GBS positive   Prenatal Transfer Tool  Maternal Diabetes: No Genetic Screening: Normal Maternal Ultrasounds/Referrals: Normal Fetal Ultrasounds or other Referrals:  None Maternal Substance Abuse:  No Significant Maternal Medications:  None Significant Maternal Lab Results: None     OB History    Gravida Para Term Preterm AB Living   4 1 1   2 1    SAB TAB Ectopic Multiple Live Births   1 1     1      Past Medical History:  Diagnosis Date  . Cocaine abuse    8 years ago, went to rehab  . Depression   . Vaginal Pap smear, abnormal    Past Surgical History:  Procedure Laterality Date  . CERVICAL CONE BIOPSY    . WISDOM TOOTH EXTRACTION     Family History: family history includes Diabetes in her maternal grandmother; Hypertension in her mother. Social History:  reports that she has been smoking.  She has been smoking about 1.00 pack per day. She has never used smokeless tobacco. She reports that she drinks alcohol. She reports that she does not use drugs.  Review of Systems - Negative except Discomfort of pregnancy     Temperature 97.9 F (36.6 C), temperature source Oral, resp. rate 18.  Physical Exam:  Gen: well appearing, no distress  Back: no CVAT Abd: gravid, NT, no RUQ pain LE: Trace edema, equal bilaterally, non-tender. DTR 1+ Toco: Rare FH: baseline 140s, accelerations present, no deceleratons, 10 beat variability  Prenatal labs: ABO, Rh:  O/Negative/-- (02/23 0000) Antibody: Negative (02/23 0000) Rubella: !Error! Immune RPR: Nonreactive (02/23 0000)  HBsAg: Negative (02/23 0000)  HIV: Non-reactive (02/23 0000)  GBS: Positive (08/22 0000)  1 hr Glucola 101  Genetic screening normal NT, normal AFP Anatomy US normal   Assessment/Plan: 30 y.o. [id number] at 2156w0d Elective induction of labor in a multiparous patient. Patient is aware of risks of induction including increased risk of C-section. Will plan Pitocin induction of labor. AROM when able. May need cervical Foley. History of LEEP and cervical scar noted on exam.  - Reactive fetal testing - Penicillin for GBS - Rh-. Plan Rogan postpartum - History of drug abuse avoid narcotics. Patient planning epidural   Jamecia Lerman A. 06/08/2016, 1:57 AM

## 2016-06-09 LAB — CBC
HCT: 28.5 % — ABNORMAL LOW (ref 36.0–46.0)
Hemoglobin: 9.9 g/dL — ABNORMAL LOW (ref 12.0–15.0)
MCH: 32.1 pg (ref 26.0–34.0)
MCHC: 34.7 g/dL (ref 30.0–36.0)
MCV: 92.5 fL (ref 78.0–100.0)
PLATELETS: 239 10*3/uL (ref 150–400)
RBC: 3.08 MIL/uL — AB (ref 3.87–5.11)
RDW: 13.7 % (ref 11.5–15.5)
WBC: 12.8 10*3/uL — ABNORMAL HIGH (ref 4.0–10.5)

## 2016-06-09 MED ORDER — NICOTINE 21 MG/24HR TD PT24: 21.0000 mg | MEDICATED_PATCH | Freq: Every day | TRANSDERMAL | 0 refills | Status: DC

## 2016-06-09 MED ORDER — IBUPROFEN 800 MG PO TABS
800.0000 mg | ORAL_TABLET | Freq: Four times a day (QID) | ORAL | Status: DC
  Administered 2016-06-09: 800 mg via ORAL
  Filled 2016-06-09: qty 1

## 2016-06-09 MED ORDER — RHO D IMMUNE GLOBULIN 1500 UNIT/2ML IJ SOSY
300.0000 ug | PREFILLED_SYRINGE | Freq: Once | INTRAMUSCULAR | Status: AC
  Administered 2016-06-09: 300 ug via INTRAVENOUS
  Filled 2016-06-09: qty 2

## 2016-06-09 MED ORDER — IBUPROFEN 800 MG PO TABS: 800.0000 mg | ORAL_TABLET | Freq: Four times a day (QID) | ORAL | 0 refills | Status: AC

## 2016-06-09 NOTE — Lactation Note (Signed)
This note was copied from a baby's chart. Lactation Consultation Note  Patient Name: Carolyn Roach ZOXWR'UToday's Date: 06/09/2016   Mom feels that nursing is going well. Infant is 4819 hours old and has gone to the breast 8 times. Infant sleeping peacefully in Dad's arms during consult (infant had fed for 30 min 1 hr ago).   Per report, Mom is feeling normal tenderness w/initial latch, but dislikes the feel of fabric on her nipples. Breast exam done: Mom's nipples are intact. Mom placed in shells and encouraged to wear them prn to protect nipples from rubbing against fabric, etc.   Mom has good vein distribution on breasts. Mom reports having had a good supply with her 1st child (now 259 years old).  Formula present in room, but Mom says infant is not interested in taking a bottle. In addition, Mom reports really wanting to breastfeed. Mom has a DEBP from her insurance co that needs to be picked up (Mom is unsure of which brand). I provided Mom w/a hand pump in case needed before she gets her hand pump (and Mom understands that if formula is given then a pumping session should be done). I showed parents how to assemble & use hand pump. Size 24 flanges are the correct fit at this time.   Parents taught signs/sound of swallowing.  Mom seems very pleased w/lactation consult.  Note: Mom smokes 1ppd. Mom aware that won't affect quality of breast milk, but could potentially affect supply.   [name], [name] [facility] 06/09/2016, 11:32 AM

## 2016-06-09 NOTE — Progress Notes (Signed)
Patient ID: Carolyn Roach, female   DOB: 01/13/1986, 30 y.o.   MRN: [id number] PPD # 1 SVD  S:  Reports feeling well. Desires an early d/c home.             Tolerating po/ No nausea or vomiting             Bleeding is light             Pain controlled with ibuprofen (OTC)             Up ad lib / ambulatory / voiding without difficulties     Information for the patient's newbornLuiz Blare:  Schaller, Girl Dahlia ClientHannah [[id number]][[id number]]  female  Breast/bottle feeding   O:  A & O x 3, in no apparent distress              VS:  Vitals:   06/08/16 1800 06/08/16 1900 06/08/16 2315 06/09/16 0556  BP: 108/70 121/63 97/82 102/64  Pulse: 78 65 85 87  Resp: 17 18 17 18   Temp: 97.8 F (36.6 C) 98.2 F (36.8 C) 98.3 F (36.8 C) 97.5 F (36.4 C)  TempSrc: Oral Oral Oral Axillary  SpO2:   99%   Weight:      Height:        LABS:  Recent Labs  06/08/16 0115 06/09/16 0541  WBC 16.9* 12.8*  HGB 11.4* 9.9*  HCT 32.3* 28.5*  PLT 281 239    Blood type: O NEG (09/13 0115) / Infant Rh POS / Rhophylac indicated  Rubella: Immune (02/23 0000)      Abdomen: soft, non-tender, non-distended             Fundus: firm, non-tender, U-2  Perineum: Intact, no edema  Lochia: minimal  Extremities: No edema, no calf pain or tenderness    A/P: PPD # 1 29 y.o., [id number]   Principal Problem:   Postpartum care following vaginal delivery (9/13)    Doing well - stable status  Routine post partum orders  Early d/c home later today (after 4 pm)    Raelyn MoraAWSON, Olinda Nola, M, MSN, CNM 06/09/2016, 8:53 AM

## 2016-06-09 NOTE — Discharge Summary (Signed)
OB Discharge Summary     Patient Name: Carolyn BatonHannah Dennen DOB: 01/14/1986 MRN: [id number]  Date of admission: 06/08/2016 Delivering MD: Neta MendsPAUL, DANIELA C , CNM  Date of discharge: 06/09/2016  Admitting diagnosis: INDUCTION Intrauterine pregnancy: 5368w0d     Secondary diagnosis:  Principal Problem:   Postpartum care following vaginal delivery (9/13)  Additional problems: none     Discharge diagnosis: Term Pregnancy Delivered                                                                                                Post partum procedures:rhogam  Augmentation: AROM and Pitocin  Complications: None  Hospital course:  Induction of Labor With Vaginal Delivery   30 y.o. yo [id number] at 1868w0d was admitted to the hospital 06/08/2016 for induction of labor.  Indication for induction: Favorable cervix at term and elective for multiparity.  Patient had an uncomplicated labor course as follows: Membrane Rupture Time/Date: 7:45 AM ,06/08/2016   Intrapartum Procedures: Episiotomy: None [1]                                         Lacerations:  None [1]  Patient had delivery of a Viable infant.  Information for the patient's newborn:  Luiz BlareGraves, Girl Dahlia ClientHannah [[id number]][[id number]]  Delivery Method: Vaginal, Spontaneous Delivery (Filed from Delivery Summary)   06/08/2016  Details of delivery can be found in separate delivery note.  Patient had a routine postpartum course. Patient is discharged home 06/09/16.   Physical exam Vitals:   06/08/16 1800 06/08/16 1900 06/08/16 2315 06/09/16 0556  BP: 108/70 121/63 97/82 102/64  Pulse: 78 65 85 87  Resp: 17 18 17 18   Temp: 97.8 F (36.6 C) 98.2 F (36.8 C) 98.3 F (36.8 C) 97.5 F (36.4 C)  TempSrc: Oral Oral Oral Axillary  SpO2:   99%   Weight:      Height:       General: alert, cooperative and no distress Lochia: appropriate Uterine Fundus: firm, midline, U-2 DVT Evaluation: No evidence of DVT seen on physical exam. Negative Homan's sign. No cords or  calf tenderness. No significant calf/ankle edema. Labs: Lab Results  Component Value Date   WBC 12.8 (H) 06/09/2016   HGB 9.9 (L) 06/09/2016   HCT 28.5 (L) 06/09/2016   MCV 92.5 06/09/2016   PLT 239 06/09/2016   No flowsheet data found.  Discharge instruction: per After Visit Summary and "Baby and Me Booklet".  After visit meds:    Medication List    TAKE these medications   acetaminophen 325 MG tablet Commonly known as:  TYLENOL Take 650 mg by mouth every 6 (six) hours as needed for mild pain or headache.   ibuprofen 800 MG tablet Commonly known as:  ADVIL,MOTRIN Take 1 tablet (800 mg total) by mouth every 6 (six) hours.   nicotine 21 mg/24hr patch Commonly known as:  NICODERM CQ - dosed in mg/24 hours Place 1 patch (21 mg total) onto the skin daily.  Diet: routine diet  Activity: Advance as tolerated. Pelvic rest for 6 weeks.   Outpatient follow up:6 weeks Follow up Appt:No future appointments. Follow up Visit:No Follow-up on file.  Postpartum contraception: Undecided  Newborn Data: Live born female on 06/08/2016 Birth Weight: 6 lb 8.8 oz (2971 g) APGAR: 9, 9  Baby Feeding: Bottle and Breast Disposition:home with mother   06/09/2016 Raelyn Mora, Judie Petit, CNM

## 2016-06-09 NOTE — Discharge Instructions (Signed)
Breast Pumping Tips °If you are breastfeeding, there may be times when you cannot feed your baby directly. Returning to work or going on a trip are common examples. Pumping allows you to store breast milk and feed it to your baby later.  °You may not get much milk when you first start to pump. Your breasts should start to make more after a few days. If you pump at the times you usually feed your baby, you may be able to keep making enough milk to feed your baby without also using formula. The more often you pump, the more milk you will produce.  °WHEN SHOULD I PUMP?  °· You can begin to pump soon after delivery. However, some experts recommend waiting about 4 weeks before giving your infant a bottle to make sure breastfeeding is going well.  °· If you plan to return to work, begin pumping a few weeks before. This will help you develop techniques that work best for you. It also lets you build up a supply of breast milk.   °· When you are with your infant, feed on demand and pump after each feeding.   °· When you are away from your infant for several hours, pump for about 15 minutes every 2-3 hours. Pump both breasts at the same time if you can.   °· If your infant has a formula feeding, make sure to pump around the same time.     °· If you drink any alcohol, wait 2 hours before pumping.   °HOW DO I PREPARE TO PUMP? °Your let-down reflex is the natural reaction to stimulation that makes your breast milk flow. It is easier to stimulate this reflex when you are relaxed. Find relaxation techniques that work for you. If you have difficulty with your let-down reflex, try these methods:  °· Smell one of your infant's blankets or an item of clothing.   °· Look at a picture or video of your infant.   °· Sit in a quiet, private space.   °· Massage the breast you plan to pump.   °· Place soothing warmth on the breast.   °· Play relaxing music.   °WHAT ARE SOME GENERAL BREAST PUMPING TIPS? °· Wash your hands before you pump. You  do not need to wash your nipples or breasts. °· There are three ways to pump. °¨ You can use your hand to massage and compress your breast. °¨ You can use a handheld manual pump. °¨ You can use an electric pump.   °· Make sure the suction cup (flange) on the breast pump is the right size. Place the flange directly over the nipple. If it is the wrong size or placed the wrong way, it may be painful and cause nipple damage.   °· If pumping is uncomfortable, apply a small amount of purified or modified lanolin to your nipple and areola. °· If you are using an electric pump, adjust the speed and suction power to be more comfortable. °· If pumping is painful or if you are not getting very much milk, you may need a different type of pump. A lactation consultant can help you determine what type of pump to use.   °· Keep a full water bottle near you at all times. Drinking lots of fluid helps you make more milk.  °· You can store your milk to use later. Pumped breast milk can be stored in a sealable, sterile container or plastic bag. Label all stored breast milk with the date you pumped it. °¨ Milk can stay out at room temperature for up to 8 hours. °¨   You can store your milk in the refrigerator for up to 8 days. °¨ You can store your milk in the freezer for 3 months. Thaw frozen milk using warm water. Do not put it in the microwave. °· Do not smoke. Smoking can lower your milk supply and harm your infant. If you need help quitting, ask your health care provider to recommend a program.   °WHEN SHOULD I CALL MY HEALTH CARE PROVIDER OR A LACTATION CONSULTANT? °· You are having trouble pumping. °· You are concerned that you are not making enough milk. °· You have nipple pain, soreness, or redness. °· You want to use birth control. Birth control pills may lower your milk supply. Talk to your health care provider about your options. °  °This information is not intended to replace advice given to you by your health care provider.  Make sure you discuss any questions you have with your health care provider. °  °Document Released: 03/02/2010 Document Revised: 09/17/2013 Document Reviewed: 07/05/2013 °[employer]vier Interactive Patient Education ©2016 [employer] °Postpartum Depression and Baby Blues °The postpartum period begins right after the birth of a baby. During this time, there is often a great amount of joy and excitement. It is also a time of many changes in the life of the parents. Regardless of how many times a mother gives birth, each child brings new challenges and dynamics to the family. It is not unusual to have feelings of excitement along with confusing shifts in moods, emotions, and thoughts. All mothers are at risk of developing postpartum depression or the "baby blues." These mood changes can occur right after giving birth, or they may occur many months after giving birth. The baby blues or postpartum depression can be mild or severe. Additionally, postpartum depression can go away rather quickly, or it can be a long-term condition.  °CAUSES °Raised hormone levels and the rapid drop in those levels are thought to be a main cause of postpartum depression and the baby blues. A number of hormones change during and after pregnancy. Estrogen and progesterone usually decrease right after the delivery of your baby. The levels of thyroid hormone and various cortisol steroids also rapidly drop. Other factors that play a role in these mood changes include major life events and genetics.  °RISK FACTORS °If you have any of the following risks for the baby blues or postpartum depression, know what symptoms to watch out for during the postpartum period. Risk factors that may increase the likelihood of getting the baby blues or postpartum depression include: °· Having a personal or family history of depression.   °· Having depression while being pregnant.   °· Having premenstrual mood issues or mood issues related to oral  contraceptives. °· Having a lot of life stress.   °· Having marital conflict.   °· Lacking a social support network.   °· Having a baby with special needs.   °· Having health problems, such as diabetes.   °SIGNS AND SYMPTOMS °Symptoms of baby blues include: °· Brief changes in mood, such as going from extreme happiness to sadness. °· Decreased concentration.   °· Difficulty sleeping.   °· Crying spells, tearfulness.   °· Irritability.   °· Anxiety.   °Symptoms of postpartum depression typically begin within the first month after giving birth. These symptoms include: °· Difficulty sleeping or excessive sleepiness.   °· Marked weight loss.   °· Agitation.   °· Feelings of worthlessness.   °· Lack of interest in activity or food.   °Postpartum psychosis is a very serious condition and can be dangerous. Fortunately, it is   rare. Displaying any of the following symptoms is cause for immediate medical attention. Symptoms of postpartum psychosis include:  °· Hallucinations and delusions.   °· Bizarre or disorganized behavior.   °· Confusion or disorientation.   °DIAGNOSIS  °A diagnosis is made by an evaluation of your symptoms. There are no medical or lab tests that lead to a diagnosis, but there are various questionnaires that a health care provider may use to identify those with the baby blues, postpartum depression, or psychosis. Often, a screening tool called the Edinburgh Postnatal Depression Scale is used to diagnose depression in the postpartum period.  °TREATMENT °The baby blues usually goes away on its own in 1-2 weeks. Social support is often all that is needed. You will be encouraged to get adequate sleep and rest. Occasionally, you may be given medicines to help you sleep.  °Postpartum depression requires treatment because it can last several months or longer if it is not treated. Treatment may include individual or group therapy, medicine, or both to address any social, physiological, and psychological factors  that may play a role in the depression. Regular exercise, a healthy diet, rest, and social support may also be strongly recommended.  °Postpartum psychosis is more serious and needs treatment right away. Hospitalization is often needed. °HOME CARE INSTRUCTIONS °· Get as much rest as you can. Nap when the baby sleeps.   °· Exercise regularly. Some women find yoga and walking to be beneficial.   °· Eat a balanced and nourishing diet.   °· Do little things that you enjoy. Have a cup of tea, take a bubble bath, read your favorite magazine, or listen to your favorite music. °· Avoid alcohol.   °· Ask for help with household chores, cooking, grocery shopping, or running errands as needed. Do not try to do everything.   °· Talk to people close to you about how you are feeling. Get support from your partner, family members, friends, or other new moms. °· Try to stay positive in how you think. Think about the things you are grateful for.   °· Do not spend a lot of time alone.   °· Only take over-the-counter or prescription medicine as directed by your health care provider. °· Keep all your postpartum appointments.   °· Let your health care provider know if you have any concerns.   °SEEK MEDICAL CARE IF: °You are having a reaction to or problems with your medicine. °SEEK IMMEDIATE MEDICAL CARE IF: °· You have suicidal feelings.   °· You think you may harm the baby or someone else. °MAKE SURE YOU: °· Understand these instructions. °· Will watch your condition. °· Will get help right away if you are not doing well or get worse. °  °This information is not intended to replace advice given to you by your health care provider. Make sure you discuss any questions you have with your health care provider. °  °Document Released: 06/16/2004 Document Revised: 09/17/2013 Document Reviewed: 06/24/2013 °Elsevier Interactive Patient Education ©2016 [employer] °Postpartum Care After Vaginal Delivery °After you deliver your newborn  (postpartum period), the usual stay in the hospital is 24-72 hours. If there were problems with your labor or delivery, or if you have other medical problems, you might be in the hospital longer.  °While you are in the hospital, you will receive help and instructions on how to care for yourself and your newborn during the postpartum period.  °While you are in the hospital: °· Be sure to tell your nurses if you have pain or discomfort, as well as   where you feel the pain and what makes the pain worse. °· If you had an incision made near your vagina (episiotomy) or if you had some tearing during delivery, the nurses may put ice packs on your episiotomy or tear. The ice packs may help to reduce the pain and swelling. °· If you are breastfeeding, you may feel uncomfortable contractions of your uterus for a couple of weeks. This is normal. The contractions help your uterus get back to normal size. °· It is normal to have some bleeding after delivery. °¨ For the first 1-3 days after delivery, the flow is red and the amount may be similar to a period. °¨ It is common for the flow to start and stop. °¨ In the first few days, you may pass some small clots. Let your nurses know if you begin to pass large clots or your flow increases. °¨ Do not  flush blood clots down the toilet before having the nurse look at them. °¨ During the next 3-10 days after delivery, your flow should become more watery and pink or brown-tinged in color. °¨ Ten to fourteen days after delivery, your flow should be a small amount of yellowish-white discharge. °¨ The amount of your flow will decrease over the first few weeks after delivery. Your flow may stop in 6-8 weeks. Most women have had their flow stop by 12 weeks after delivery. °· You should change your sanitary pads frequently. °· Wash your hands thoroughly with soap and water for at least 20 seconds after changing pads, using the toilet, or before holding or feeding your newborn. °· You should  feel like you need to empty your bladder within the first 6-8 hours after delivery. °· In case you become weak, lightheaded, or faint, call your nurse before you get out of bed for the first time and before you take a shower for the first time. °· Within the first few days after delivery, your breasts may begin to feel tender and full. This is called engorgement. Breast tenderness usually goes away within 48-72 hours after engorgement occurs. You may also notice milk leaking from your breasts. If you are not breastfeeding, do not stimulate your breasts. Breast stimulation can make your breasts produce more milk. °· Spending as much time as possible with your newborn is very important. During this time, you and your newborn can feel close and get to know each other. Having your newborn stay in your room (rooming in) will help to strengthen the bond with your newborn.  It will give you time to get to know your newborn and become comfortable caring for your newborn. °· Your hormones change after delivery. Sometimes the hormone changes can temporarily cause you to feel sad or tearful. These feelings should not last more than a few days. If these feelings last longer than that, you should talk to your caregiver. °· If desired, talk to your caregiver about methods of family planning or contraception. °· Talk to your caregiver about immunizations. Your caregiver may want you to have the following immunizations before leaving the hospital: °¨ Tetanus, diphtheria, and pertussis (Tdap) or tetanus and diphtheria (Td) immunization. It is very important that you and your family (including grandparents) or others caring for your newborn are up-to-date with the Tdap or Td immunizations. The Tdap or Td immunization can help protect your newborn from getting ill. °¨ Rubella immunization. °¨ Varicella (chickenpox) immunization. °¨ Influenza immunization. You should receive this annual immunization if you did not receive the    immunization during your pregnancy. °  °This information is not intended to replace advice given to you by your health care provider. Make sure you discuss any questions you have with your health care provider. °  °Document Released: 07/10/2007 Document Revised: 06/06/2012 Document Reviewed: 05/09/2012 °[employer]vier Interactive Patient Education ©2016 [employer] °Breastfeeding and Mastitis °Mastitis is inflammation of the breast tissue. It can occur in women who are breastfeeding. This can make breastfeeding painful. Mastitis will sometimes go away on its own. Your health care provider will help determine if treatment is needed. °CAUSES °Mastitis is often associated with a blocked milk (lactiferous) duct. This can happen when too much milk builds up in the breast. Causes of excess milk in the breast can include: °· Poor latch-on. If your baby is not latched onto the breast properly, she or he may not empty your breast completely while breastfeeding. °· Allowing too much time to pass between feedings. °· Wearing a bra or other clothing that is too tight. This puts extra pressure on the lactiferous ducts so milk does not flow through them as it should. °Mastitis can also be caused by a bacterial infection. Bacteria may enter the breast tissue through cuts or openings in the skin. In women who are breastfeeding, this may occur because of cracked or irritated skin. Cracks in the skin are often caused when your baby does not latch on properly to the breast. °SIGNS AND SYMPTOMS °· Swelling, redness, tenderness, and pain in an area of the breast. °· Swelling of the glands under the arm on the same side. °· Fever may or may not accompany mastitis. °If an infection is allowed to progress, a collection of pus (abscess) may develop. °DIAGNOSIS  °Your health care provider can usually diagnose mastitis based on your symptoms and a physical exam. Tests may be done to help confirm the diagnosis. These may include: °· Removal of pus  from the breast by applying pressure to the area. This pus can be examined in the lab to determine which bacteria are present. If an abscess has developed, the fluid in the abscess can be removed with a needle. This can also be used to confirm the diagnosis and determine the bacteria present. In most cases, pus will not be present. °· Blood tests to determine if your body is fighting a bacterial infection. °· Mammogram or ultrasound tests to rule out other problems or diseases. °TREATMENT  °Mastitis that occurs with breastfeeding will sometimes go away on its own. Your health care provider may choose to wait 24 hours after first seeing you to decide whether a prescription medicine is needed. If your symptoms are worse after 24 hours, your health care provider will likely prescribe an antibiotic medicine to treat the mastitis. He or she will determine which bacteria are most likely causing the infection and will then select an appropriate antibiotic medicine. This is sometimes changed based on the results of tests performed to identify the bacteria, or if there is no response to the antibiotic medicine selected. Antibiotic medicines are usually given by mouth. You may also be given medicine for pain. °HOME CARE INSTRUCTIONS °· Only take over-the-counter or prescription medicines for pain, fever, or discomfort as directed by your health care provider. °· If your health care provider prescribed an antibiotic medicine, take the medicine as directed. Make sure you finish it even if you start to feel better. °· Do not wear a tight or underwire bra. Wear a soft, supportive bra. °· Increase your fluid   intake, especially if you have a fever. °· Continue to empty the breast. Your health care provider can tell you whether this milk is safe for your infant or needs to be thrown out. You may be told to stop nursing until your health care provider thinks it is safe for your baby. Use a breast pump if you are advised to stop  nursing. °· Keep your nipples clean and dry. °· Empty the first breast completely before going to the other breast. If your baby is not emptying your breasts completely for some reason, use a breast pump to empty your breasts. °· If you go back to work, pump your breasts while at work to stay in time with your nursing schedule. °· Avoid allowing your breasts to become overly filled with milk (engorged). °SEEK MEDICAL CARE IF: °· You have pus-like discharge from the breast. °· Your symptoms do not improve with the treatment prescribed by your health care provider within 2 days. °SEEK IMMEDIATE MEDICAL CARE IF: °· Your pain and swelling are getting worse. °· You have pain that is not controlled with medicine. °· You have a red line extending from the breast toward your armpit. °· You have a fever or persistent symptoms for more than 2-3 days. °· You have a fever and your symptoms suddenly get worse. °MAKE SURE YOU:  °· Understand these instructions. °· Will watch your condition. °· Will get help right away if you are not doing well or get worse. °  °This information is not intended to replace advice given to you by your health care provider. Make sure you discuss any questions you have with your health care provider. °  °Document Released: 01/07/2005 Document Revised: 09/17/2013 Document Reviewed: 04/18/2013 °[employer]vier Interactive Patient Education ©2016 [employer] ° °Breastfeeding °Deciding to breastfeed is one of the best choices you can make for you and your baby. A change in hormones during pregnancy causes your breast tissue to grow and increases the number and size of your milk ducts. These hormones also allow proteins, sugars, and fats from your blood supply to make breast milk in your milk-producing glands. Hormones prevent breast milk from being released before your baby is born as well as prompt milk flow after birth. Once breastfeeding has begun, thoughts of your baby, as well as his or her sucking or  crying, can stimulate the release of milk from your milk-producing glands.  °BENEFITS OF BREASTFEEDING °For Your Baby °· Your first milk (colostrum) helps your baby's digestive system function better. °· There are antibodies in your milk that help your baby fight off infections. °· Your baby has a lower incidence of asthma, allergies, and sudden infant death syndrome. °· The nutrients in breast milk are better for your baby than infant formulas and are designed uniquely for your baby's needs. °· Breast milk improves your baby's brain development. °· Your baby is less likely to develop other conditions, such as childhood obesity, asthma, or type 2 diabetes mellitus. °For You °· Breastfeeding helps to create a very special bond between you and your baby. °· Breastfeeding is convenient. Breast milk is always available at the correct temperature and costs nothing. °· Breastfeeding helps to burn calories and helps you lose the weight gained during pregnancy. °· Breastfeeding makes your uterus contract to its prepregnancy size faster and slows bleeding (lochia) after you give birth.   °· Breastfeeding helps to lower your risk of developing type 2 diabetes mellitus, osteoporosis, and breast or ovarian cancer later in life. °  SIGNS THAT YOUR BABY IS HUNGRY °Early Signs of Hunger °· Increased alertness or activity. °· Stretching. °· Movement of the head from side to side. °· Movement of the head and opening of the mouth when the corner of the mouth or cheek is stroked (rooting). °· Increased sucking sounds, smacking lips, cooing, sighing, or squeaking. °· Hand-to-mouth movements. °· Increased sucking of fingers or hands. °Late Signs of Hunger °· Fussing. °· Intermittent crying. °Extreme Signs of Hunger °Signs of extreme hunger will require calming and consoling before your baby will be able to breastfeed successfully. Do not wait for the following signs of extreme hunger to occur before you initiate  breastfeeding: °· Restlessness. °· A loud, strong cry. °· Screaming. °BREASTFEEDING BASICS °Breastfeeding Initiation °· Find a comfortable place to sit or lie down, with your neck and back well supported. °· Place a pillow or rolled up blanket under your baby to bring him or her to the level of your breast (if you are seated). Nursing pillows are specially designed to help support your arms and your baby while you breastfeed. °· Make sure that your baby's abdomen is facing your abdomen. °· Gently massage your breast. With your fingertips, massage from your chest wall toward your nipple in a circular motion. This encourages milk flow. You may need to continue this action during the feeding if your milk flows slowly. °· Support your breast with 4 fingers underneath and your thumb above your nipple. Make sure your fingers are well away from your nipple and your baby's mouth. °· Stroke your baby's lips gently with your finger or nipple. °· When your baby's mouth is open wide enough, quickly bring your baby to your breast, placing your entire nipple and as much of the colored area around your nipple (areola) as possible into your baby's mouth. °¨ More areola should be visible above your baby's upper lip than below the lower lip. °¨ Your baby's tongue should be between his or her lower gum and your breast. °· Ensure that your baby's mouth is correctly positioned around your nipple (latched). Your baby's lips should create a seal on your breast and be turned out (everted). °· It is common for your baby to suck about 2-3 minutes in order to start the flow of breast milk. °Latching °Teaching your baby how to latch on to your breast properly is very important. An improper latch can cause nipple pain and decreased milk supply for you and poor weight gain in your baby. Also, if your baby is not latched onto your nipple properly, he or she may swallow some air during feeding. This can make your baby fussy. Burping your baby when  you switch breasts during the feeding can help to get rid of the air. However, teaching your baby to latch on properly is still the best way to prevent fussiness from swallowing air while breastfeeding. °Signs that your baby has successfully latched on to your nipple: °· Silent tugging or silent sucking, without causing you pain. °· Swallowing heard between every 3-4 sucks. °· Muscle movement above and in front of his or her ears while sucking. °Signs that your baby has not successfully latched on to nipple: °· Sucking sounds or smacking sounds from your baby while breastfeeding. °· Nipple pain. °If you think your baby has not latched on correctly, slip your finger into the corner of your baby's mouth to break the suction and place it between your baby's gums. Attempt breastfeeding initiation again. °Signs of Successful Breastfeeding °  Signs from your baby: °· A gradual decrease in the number of sucks or complete cessation of sucking. °· Falling asleep. °· Relaxation of his or her body. °· Retention of a small amount of milk in his or her mouth. °· Letting go of your breast by himself or herself. °Signs from you: °· Breasts that have increased in firmness, weight, and size 1-3 hours after feeding. °· Breasts that are softer immediately after breastfeeding. °· Increased milk volume, as well as a change in milk consistency and color by the fifth day of breastfeeding. °· Nipples that are not sore, cracked, or bleeding. °Signs That Your Baby is Getting Enough Milk °· Wetting at least 3 diapers in a 24-hour period. The urine should be clear and pale yellow by age 5 days. °· At least 3 stools in a 24-hour period by age 5 days. The stool should be soft and yellow. °· At least 3 stools in a 24-hour period by age 7 days. The stool should be seedy and yellow. °· No loss of weight greater than 10% of birth weight during the first 3 days of age. °· Average weight gain of 4-7 ounces (113-198 g) per week after age 4  days. °· Consistent daily weight gain by age 5 days, without weight loss after the age of 2 weeks. °After a feeding, your baby may spit up a small amount. This is common. °BREASTFEEDING FREQUENCY AND DURATION °Frequent feeding will help you make more milk and can prevent sore nipples and breast engorgement. Breastfeed when you feel the need to reduce the fullness of your breasts or when your baby shows signs of hunger. This is called "breastfeeding on demand." Avoid introducing a pacifier to your baby while you are working to establish breastfeeding (the first 4-6 weeks after your baby is born). After this time you may choose to use a pacifier. Research has shown that pacifier use during the first year of a baby's life decreases the risk of sudden infant death syndrome (SIDS). °Allow your baby to feed on each breast as long as he or she wants. Breastfeed until your baby is finished feeding. When your baby unlatches or falls asleep while feeding from the first breast, offer the second breast. Because newborns are often sleepy in the first few weeks of life, you may need to awaken your baby to get him or her to feed. °Breastfeeding times will vary from baby to baby. However, the following rules can serve as a guide to help you ensure that your baby is properly fed: °· Newborns (babies 4 weeks of age or younger) may breastfeed every 1-3 hours. °· Newborns should not go longer than 3 hours during the day or 5 hours during the night without breastfeeding. °· You should breastfeed your baby a minimum of 8 times in a 24-hour period until you begin to introduce solid foods to your baby at around 6 months of age. °BREAST MILK PUMPING °Pumping and storing breast milk allows you to ensure that your baby is exclusively fed your breast milk, even at times when you are unable to breastfeed. This is especially important if you are going back to work while you are still breastfeeding or when you are not able to be present during  feedings. Your lactation consultant can give you guidelines on how long it is safe to store breast milk. °A breast pump is a machine that allows you to pump milk from your breast into a sterile bottle. The pumped breast milk can   then be stored in a refrigerator or freezer. Some breast pumps are operated by hand, while others use electricity. Ask your lactation consultant which type will work best for you. Breast pumps can be purchased, but some hospitals and breastfeeding support groups lease breast pumps on a monthly basis. A lactation consultant can teach you how to hand express breast milk, if you prefer not to use a pump. °CARING FOR YOUR BREASTS WHILE YOU BREASTFEED °Nipples can become dry, cracked, and sore while breastfeeding. The following recommendations can help keep your breasts moisturized and healthy: °· Avoid using soap on your nipples. °· Wear a supportive bra. Although not required, special nursing bras and tank tops are designed to allow access to your breasts for breastfeeding without taking off your entire bra or top. Avoid wearing underwire-style bras or extremely tight bras. °· Air dry your nipples for 3-4 minutes after each feeding. °· Use only cotton bra pads to absorb leaked breast milk. Leaking of breast milk between feedings is normal. °· Use lanolin on your nipples after breastfeeding. Lanolin helps to maintain your skin's normal moisture barrier. If you use pure lanolin, you do not need to wash it off before feeding your baby again. Pure lanolin is not toxic to your baby. You may also hand express a few drops of breast milk and gently massage that milk into your nipples and allow the milk to air dry. °In the first few weeks after giving birth, some women experience extremely full breasts (engorgement). Engorgement can make your breasts feel heavy, warm, and tender to the touch. Engorgement peaks within 3-5 days after you give birth. The following recommendations can help ease  engorgement: °· Completely empty your breasts while breastfeeding or pumping. You may want to start by applying warm, moist heat (in the shower or with warm water-soaked hand towels) just before feeding or pumping. This increases circulation and helps the milk flow. If your baby does not completely empty your breasts while breastfeeding, pump any extra milk after he or she is finished. °· Wear a snug bra (nursing or regular) or tank top for 1-2 days to signal your body to slightly decrease milk production. °· Apply ice packs to your breasts, unless this is too uncomfortable for you. °· Make sure that your baby is latched on and positioned properly while breastfeeding. °If engorgement persists after 48 hours of following these recommendations, contact your health care provider or a lactation consultant. °OVERALL HEALTH CARE RECOMMENDATIONS WHILE BREASTFEEDING °· Eat healthy foods. Alternate between meals and snacks, eating 3 of each per day. Because what you eat affects your breast milk, some of the foods may make your baby more irritable than usual. Avoid eating these foods if you are sure that they are negatively affecting your baby. °· Drink milk, fruit juice, and water to satisfy your thirst (about 10 glasses a day). °· Rest often, relax, and continue to take your prenatal vitamins to prevent fatigue, stress, and anemia. °· Continue breast self-awareness checks. °· Avoid chewing and smoking tobacco. Chemicals from cigarettes that pass into breast milk and exposure to secondhand smoke may harm your baby. °· Avoid alcohol and drug use, including marijuana. °Some medicines that may be harmful to your baby can pass through breast milk. It is important to ask your health care provider before taking any medicine, including all over-the-counter and prescription medicine as well as vitamin and herbal supplements. °It is possible to become pregnant while breastfeeding. If birth control is desired, ask your health care    provider about options that will be safe for your baby. °SEEK MEDICAL CARE IF: °· You feel like you want to stop breastfeeding or have become frustrated with breastfeeding. °· You have painful breasts or nipples. °· Your nipples are cracked or bleeding. °· Your breasts are red, tender, or warm. °· You have a swollen area on either breast. °· You have a fever or chills. °· You have nausea or vomiting. °· You have drainage other than breast milk from your nipples. °· Your breasts do not become full before feedings by the fifth day after you give birth. °· You feel sad and depressed. °· Your baby is too sleepy to eat well. °· Your baby is having trouble sleeping.   °· Your baby is wetting less than 3 diapers in a 24-hour period. °· Your baby has less than 3 stools in a 24-hour period. °· Your baby's skin or the white part of his or her eyes becomes yellow.   °· Your baby is not gaining weight by 5 days of age. °SEEK IMMEDIATE MEDICAL CARE IF: °· Your baby is overly tired (lethargic) and does not want to wake up and feed. °· Your baby develops an unexplained fever. °  °This information is not intended to replace advice given to you by your health care provider. Make sure you discuss any questions you have with your health care provider. °  °Document Released: 09/12/2005 Document Revised: 06/03/2015 Document Reviewed: 03/06/2013 °Elsevier Interactive Patient Education ©2016 [employer] ° °

## 2016-06-10 LAB — RH IG WORKUP (INCLUDES ABO/RH)
ABO/RH(D): O NEG
FETAL SCREEN: NEGATIVE
GESTATIONAL AGE(WKS): 39
Unit division: 0

## 2016-06-12 LAB — TYPE AND SCREEN
ABO/RH(D): O NEG
Antibody Screen: POSITIVE
DAT, IgG: NEGATIVE
UNIT DIVISION: 0
Unit division: 0

## 2016-06-27 DIAGNOSIS — Z13220 Encounter for screening for lipoid disorders: Secondary | ICD-10-CM | POA: Diagnosis not present

## 2016-07-20 DIAGNOSIS — Z3202 Encounter for pregnancy test, result negative: Secondary | ICD-10-CM | POA: Diagnosis not present

## 2016-07-20 DIAGNOSIS — Z30430 Encounter for insertion of intrauterine contraceptive device: Secondary | ICD-10-CM | POA: Diagnosis not present

## 2016-07-20 DIAGNOSIS — Z23 Encounter for immunization: Secondary | ICD-10-CM | POA: Diagnosis not present

## 2016-11-28 DIAGNOSIS — J020 Streptococcal pharyngitis: Secondary | ICD-10-CM | POA: Diagnosis not present

## 2016-11-28 DIAGNOSIS — R509 Fever, unspecified: Secondary | ICD-10-CM | POA: Diagnosis not present

## 2016-12-12 DIAGNOSIS — Z30431 Encounter for routine checking of intrauterine contraceptive device: Secondary | ICD-10-CM | POA: Diagnosis not present

## 2016-12-12 DIAGNOSIS — Z1159 Encounter for screening for other viral diseases: Secondary | ICD-10-CM | POA: Diagnosis not present

## 2016-12-12 DIAGNOSIS — Z113 Encounter for screening for infections with a predominantly sexual mode of transmission: Secondary | ICD-10-CM | POA: Diagnosis not present

## 2016-12-12 DIAGNOSIS — Z131 Encounter for screening for diabetes mellitus: Secondary | ICD-10-CM | POA: Diagnosis not present

## 2016-12-12 DIAGNOSIS — Z114 Encounter for screening for human immunodeficiency virus [HIV]: Secondary | ICD-10-CM | POA: Diagnosis not present

## 2016-12-12 DIAGNOSIS — N9089 Other specified noninflammatory disorders of vulva and perineum: Secondary | ICD-10-CM | POA: Diagnosis not present

## 2017-06-21 DIAGNOSIS — N938 Other specified abnormal uterine and vaginal bleeding: Secondary | ICD-10-CM | POA: Diagnosis not present

## 2017-06-21 DIAGNOSIS — R634 Abnormal weight loss: Secondary | ICD-10-CM | POA: Diagnosis not present

## 2017-06-21 DIAGNOSIS — Z0000 Encounter for general adult medical examination without abnormal findings: Secondary | ICD-10-CM | POA: Diagnosis not present

## 2017-06-21 DIAGNOSIS — Z1159 Encounter for screening for other viral diseases: Secondary | ICD-10-CM | POA: Diagnosis not present

## 2017-06-21 DIAGNOSIS — Z113 Encounter for screening for infections with a predominantly sexual mode of transmission: Secondary | ICD-10-CM | POA: Diagnosis not present

## 2017-06-21 DIAGNOSIS — O906 Postpartum mood disturbance: Secondary | ICD-10-CM | POA: Diagnosis not present

## 2017-06-21 DIAGNOSIS — Z13220 Encounter for screening for lipoid disorders: Secondary | ICD-10-CM | POA: Diagnosis not present

## 2017-06-21 DIAGNOSIS — Z118 Encounter for screening for other infectious and parasitic diseases: Secondary | ICD-10-CM | POA: Diagnosis not present

## 2017-06-21 DIAGNOSIS — Z114 Encounter for screening for human immunodeficiency virus [HIV]: Secondary | ICD-10-CM | POA: Diagnosis not present

## 2017-06-21 DIAGNOSIS — R102 Pelvic and perineal pain: Secondary | ICD-10-CM | POA: Diagnosis not present

## 2017-06-21 DIAGNOSIS — R109 Unspecified abdominal pain: Secondary | ICD-10-CM | POA: Diagnosis not present

## 2017-06-26 DIAGNOSIS — R1030 Lower abdominal pain, unspecified: Secondary | ICD-10-CM | POA: Diagnosis not present

## 2017-06-26 DIAGNOSIS — Z6821 Body mass index (BMI) 21.0-21.9, adult: Secondary | ICD-10-CM | POA: Diagnosis not present

## 2017-06-29 DIAGNOSIS — R102 Pelvic and perineal pain: Secondary | ICD-10-CM | POA: Diagnosis not present

## 2017-06-29 DIAGNOSIS — B373 Candidiasis of vulva and vagina: Secondary | ICD-10-CM | POA: Diagnosis not present

## 2017-06-29 DIAGNOSIS — Z309 Encounter for contraceptive management, unspecified: Secondary | ICD-10-CM | POA: Diagnosis not present

## 2017-06-29 DIAGNOSIS — Z30432 Encounter for removal of intrauterine contraceptive device: Secondary | ICD-10-CM | POA: Diagnosis not present

## 2017-11-29 DIAGNOSIS — Z6821 Body mass index (BMI) 21.0-21.9, adult: Secondary | ICD-10-CM | POA: Diagnosis not present

## 2017-11-29 DIAGNOSIS — R05 Cough: Secondary | ICD-10-CM | POA: Diagnosis not present

## 2018-02-21 DIAGNOSIS — Z118 Encounter for screening for other infectious and parasitic diseases: Secondary | ICD-10-CM | POA: Diagnosis not present

## 2018-02-21 DIAGNOSIS — R102 Pelvic and perineal pain: Secondary | ICD-10-CM | POA: Diagnosis not present

## 2018-03-12 DIAGNOSIS — R102 Pelvic and perineal pain: Secondary | ICD-10-CM | POA: Diagnosis not present

## 2018-03-28 DIAGNOSIS — Z87898 Personal history of other specified conditions: Secondary | ICD-10-CM | POA: Diagnosis not present

## 2018-03-28 DIAGNOSIS — Z3202 Encounter for pregnancy test, result negative: Secondary | ICD-10-CM | POA: Diagnosis not present

## 2018-03-28 DIAGNOSIS — Z30430 Encounter for insertion of intrauterine contraceptive device: Secondary | ICD-10-CM | POA: Diagnosis not present

## 2018-04-26 DIAGNOSIS — N83209 Unspecified ovarian cyst, unspecified side: Secondary | ICD-10-CM

## 2018-04-26 HISTORY — DX: Unspecified ovarian cyst, unspecified side: N83.209

## 2018-04-30 ENCOUNTER — Other Ambulatory Visit: Payer: Self-pay

## 2018-04-30 ENCOUNTER — Encounter (HOSPITAL_COMMUNITY): Payer: Self-pay | Admitting: Emergency Medicine

## 2018-04-30 ENCOUNTER — Emergency Department (HOSPITAL_COMMUNITY)
Admission: EM | Admit: 2018-04-30 | Discharge: 2018-04-30 | Disposition: A | Discharge status: Home / Self Care | Payer: BLUE CROSS/BLUE SHIELD | Attending: Emergency Medicine | Admitting: Emergency Medicine

## 2018-04-30 ENCOUNTER — Emergency Department (HOSPITAL_COMMUNITY): Payer: BLUE CROSS/BLUE SHIELD

## 2018-04-30 DIAGNOSIS — F17210 Nicotine dependence, cigarettes, uncomplicated: Secondary | ICD-10-CM | POA: Insufficient documentation

## 2018-04-30 DIAGNOSIS — R1084 Generalized abdominal pain: Secondary | ICD-10-CM | POA: Diagnosis present

## 2018-04-30 DIAGNOSIS — R109 Unspecified abdominal pain: Secondary | ICD-10-CM | POA: Diagnosis not present

## 2018-04-30 DIAGNOSIS — R1032 Left lower quadrant pain: Secondary | ICD-10-CM | POA: Insufficient documentation

## 2018-04-30 LAB — COMPREHENSIVE METABOLIC PANEL
ALBUMIN: 3.5 g/dL (ref 3.5–5.0)
ALT: 12 U/L (ref 0–44)
AST: 16 U/L (ref 15–41)
Alkaline Phosphatase: 60 U/L (ref 38–126)
Anion gap: 5 (ref 5–15)
BUN: 11 mg/dL (ref 6–20)
CHLORIDE: 105 mmol/L (ref 98–111)
CO2: 26 mmol/L (ref 22–32)
Calcium: 8.7 mg/dL — ABNORMAL LOW (ref 8.9–10.3)
Creatinine, Ser: 0.68 mg/dL (ref 0.44–1.00)
GFR calc Af Amer: >60 mL/min (ref >60)
GFR calc non Af Amer: >60 mL/min (ref >60)
Glucose, Bld: 100 mg/dL — ABNORMAL HIGH (ref 70–99)
POTASSIUM: 3.5 mmol/L (ref 3.5–5.1)
Sodium: 136 mmol/L (ref 135–145)
Total Bilirubin: 0.9 mg/dL (ref 0.3–1.2)
Total Protein: 6.5 g/dL (ref 6.5–8.1)

## 2018-04-30 LAB — CBC WITH DIFFERENTIAL/PLATELET
BASOS ABS: 0 10*3/uL (ref 0.0–0.1)
BASOS PCT: 0 %
EOS PCT: 1 %
Eosinophils Absolute: 0.1 10*3/uL (ref 0.0–0.7)
HCT: 35.6 % — ABNORMAL LOW (ref 36.0–46.0)
Hemoglobin: 11.9 g/dL — ABNORMAL LOW (ref 12.0–15.0)
LYMPHS ABS: 1.8 10*3/uL (ref 0.7–4.0)
LYMPHS PCT: 17 %
MCH: 32.9 pg (ref 26.0–34.0)
MCHC: 33.4 g/dL (ref 30.0–36.0)
MCV: 98.3 fL (ref 78.0–100.0)
MONO ABS: 0.6 10*3/uL (ref 0.1–1.0)
MONOS PCT: 6 %
Neutro Abs: 7.7 10*3/uL (ref 1.7–7.7)
Neutrophils Relative %: 76 %
PLATELETS: 242 10*3/uL (ref 150–400)
RBC: 3.62 MIL/uL — ABNORMAL LOW (ref 3.87–5.11)
RDW: 13.3 % (ref 11.5–15.5)
WBC: 10.2 10*3/uL (ref 4.0–10.5)

## 2018-04-30 LAB — URINALYSIS, ROUTINE W REFLEX MICROSCOPIC
BACTERIA UA: NONE SEEN
BILIRUBIN URINE: NEGATIVE
GLUCOSE, UA: NEGATIVE mg/dL
Ketones, ur: NEGATIVE mg/dL
NITRITE: NEGATIVE
Protein, ur: NEGATIVE mg/dL
SPECIFIC GRAVITY, URINE: 1.018 (ref 1.005–1.030)
pH: 5 (ref 5.0–8.0)

## 2018-04-30 LAB — PREGNANCY, URINE: Preg Test, Ur: NEGATIVE

## 2018-04-30 LAB — LIPASE, BLOOD: LIPASE: 21 U/L (ref 11–51)

## 2018-04-30 MED ORDER — OXYCODONE-ACETAMINOPHEN 5-325 MG PO TABS: 1.0000 | ORAL_TABLET | Freq: Three times a day (TID) | ORAL | 0 refills | Status: DC | PRN

## 2018-04-30 MED ORDER — SODIUM CHLORIDE 0.9 % IV BOLUS
500.0000 mL | Freq: Once | INTRAVENOUS | Status: AC
  Administered 2018-04-30: 500 mL via INTRAVENOUS

## 2018-04-30 MED ORDER — IOPAMIDOL (ISOVUE-300) INJECTION 61%
100.0000 mL | Freq: Once | INTRAVENOUS | Status: AC | PRN
  Administered 2018-04-30: 100 mL via INTRAVENOUS

## 2018-04-30 MED ORDER — FENTANYL CITRATE (PF) 100 MCG/2ML IJ SOLN
50.0000 ug | INTRAMUSCULAR | Status: DC | PRN
  Administered 2018-04-30 (×2): 50 ug via INTRAVENOUS
  Filled 2018-04-30 (×2): qty 2

## 2018-04-30 MED ORDER — OXYCODONE-ACETAMINOPHEN 5-325 MG PO TABS
1.0000 | ORAL_TABLET | Freq: Once | ORAL | Status: AC
  Administered 2018-04-30: 1 via ORAL
  Filled 2018-04-30: qty 1

## 2018-04-30 NOTE — ED Triage Notes (Signed)
Pt woken up Friday from abd pain. Took Midol with no relief. "like a ripping or burning sensation" mainly on the left side. Had IUD placed a month ago and is worried its from that.

## 2018-04-30 NOTE — ED Notes (Signed)
Pt to ct 

## 2018-04-30 NOTE — ED Notes (Signed)
Pt states pain is no better. Will notify MD

## 2018-04-30 NOTE — Discharge Instructions (Addendum)
Take Tylenol, Motrin as needed for pain.  Follow-up with your gynecologist and primary doctor later this week

## 2018-04-30 NOTE — ED Notes (Signed)
Pt in ultrasound

## 2018-04-30 NOTE — ED Triage Notes (Signed)
Pt c/o upper abd apin that raidates around left area  X 3 days. Denies gu sx. lnmb yesterday. abd distended but soft. Had IUD put in a month ago.

## 2018-04-30 NOTE — ED Provider Notes (Signed)
4:40 PM Assumed care from Dr. Jodi MourningZavitz, please see their note for full history, physical and decision making until this point. In brief this is a 32 y.o. year old female who presented to the ED tonight with Abdominal Pain     Symptoms concerning for possible ovarian pathology, pending US.   US negative. Patient still with some pain but tolerating PO. Has gyn fu tomorrow. No obvious life/limb threatening issues, will fu w/ gyn as scheduled. Stable for discharge.   Discharge instructions, including strict return precautions for new or worsening symptoms, given. Patient and/or family verbalized understanding and agreement with the plan as described.   Labs, studies and imaging reviewed by myself and considered in medical decision making if ordered. Imaging interpreted by radiology.  Labs Reviewed  CBC WITH DIFFERENTIAL/PLATELET - Abnormal; Notable for the following components:      Result Value   RBC 3.62 (*)    Hemoglobin 11.9 (*)    HCT 35.6 (*)    All other components within normal limits  COMPREHENSIVE METABOLIC PANEL - Abnormal; Notable for the following components:   Glucose, Bld 100 (*)    Calcium 8.7 (*)    All other components within normal limits  URINALYSIS, ROUTINE W REFLEX MICROSCOPIC - Abnormal; Notable for the following components:   Hgb urine dipstick SMALL (*)    Leukocytes, UA SMALL (*)    All other components within normal limits  LIPASE, BLOOD  PREGNANCY, URINE    US Pelvis Complete  Final Result    US Transvaginal Non-OB  Final Result    US Art/Ven Flow Abd Pelv Doppler  Final Result    CT ABDOMEN PELVIS W CONTRAST  Final Result      No follow-ups on file.    Beldon Nowling, Barbara CowerJason, MD 04/30/18 1640

## 2018-04-30 NOTE — ED Notes (Signed)
Pt left but did not drive. Pt's friend took her home

## 2018-04-30 NOTE — ED Provider Notes (Signed)
[facility] EMERGENCY DEPARTMENT Provider Note   CSN: [id number] Arrival date & time: 04/30/18  0940     History   Chief Complaint Chief Complaint  Patient presents with  . Abdominal Pain    HPI Carolyn Roach is a 32 y.o. female.  Patient presents with worsening left sided abdominal pain for 3 days.  No history of similar.  Patient had IUD put in a month ago.  No concern for STDs, patient is married no vaginal symptoms.  Pain sharp severe.  No history of kidney stones.  Fairly constant.  Current cigarette smoker.     Past Medical History:  Diagnosis Date  . Cocaine abuse (HCC)    8 years ago, went to rehab  . Depression   . Vaginal Pap smear, abnormal     Patient Active Problem List   Diagnosis Date Noted  . Postpartum care following vaginal delivery (9/13) 06/08/2016    Past Surgical History:  Procedure Laterality Date  . CERVICAL CONE BIOPSY    . WISDOM TOOTH EXTRACTION       OB History    Gravida  4   Para  2   Term  2   Preterm      AB  2   Living  2     SAB  1   TAB  1   Ectopic      Multiple  0   Live Births  2            Home Medications    Prior to Admission medications   Medication Sig Start Date End Date Taking? Authorizing Provider  ibuprofen (ADVIL,MOTRIN) 800 MG tablet Take 1 tablet (800 mg total) by mouth every 6 (six) hours. 06/09/16  Yes Raelyn Mora, CNM  acetaminophen (TYLENOL) 325 MG tablet Take 650 mg by mouth every 6 (six) hours as needed for mild pain or headache.     [provider]  doxycycline (VIBRA-TABS) 100 MG tablet Take 100 mg by mouth 2 (two) times daily. 02/21/18   [provider]  oxyCODONE-acetaminophen (PERCOCET) 5-325 MG tablet Take 1-2 tablets by mouth every 8 (eight) hours as needed for severe pain. 04/30/18   Mesner, Barbara Cower, MD    Family History Family History  Problem Relation Age of Onset  . Hypertension Mother   . Diabetes Maternal Grandmother     Social History Social  History   Tobacco Use  . Smoking status: Current Every Day Smoker    Packs/day: 1.00  . Smokeless tobacco: Never Used  Substance Use Topics  . Alcohol use: Yes    Comment: occassional aclohol intake  . Drug use: No     Allergies   Other   Review of Systems Review of Systems  Constitutional: Negative for chills and fever.  HENT: Negative for congestion.   Eyes: Negative for visual disturbance.  Respiratory: Negative for shortness of breath.   Cardiovascular: Negative for chest pain.  Gastrointestinal: Positive for abdominal pain. Negative for vomiting.  Genitourinary: Positive for flank pain. Negative for dysuria.  Musculoskeletal: Positive for back pain. Negative for neck pain and neck stiffness.  Skin: Negative for rash.  Neurological: Negative for light-headedness and headaches.     Physical Exam Updated Vital Signs BP (!) 93/57 (BP Location: Left Arm)   Pulse 80   Temp 98.2 F (36.8 C) (Oral)   Resp 12   LMP 04/30/2018   SpO2 100% Is stabilized and a CT is is  Physical Exam  Constitutional: She  is oriented to person, place, and time. She appears well-developed and well-nourished.  HENT:  Head: Normocephalic and atraumatic.  Eyes: Conjunctivae are normal. Right eye exhibits no discharge. Left eye exhibits no discharge.  Neck: Normal range of motion. Neck supple. No tracheal deviation present.  Cardiovascular: Normal rate and regular rhythm.  Pulmonary/Chest: Effort normal and breath sounds normal.  Abdominal: Soft. She exhibits no distension. There is tenderness (Moderate left mid abdominal region). There is no guarding.  Musculoskeletal: She exhibits no edema.  Neurological: She is alert and oriented to person, place, and time.  Skin: Skin is warm. No rash noted.  Psychiatric: She has a normal mood and affect.  Nursing note and vitals reviewed.    ED Treatments / Results  Labs (all labs ordered are listed, but only abnormal results are displayed) Labs  Reviewed  CBC WITH DIFFERENTIAL/PLATELET - Abnormal; Notable for the following components:      Result Value   RBC 3.62 (*)    Hemoglobin 11.9 (*)    HCT 35.6 (*)    All other components within normal limits  COMPREHENSIVE METABOLIC PANEL - Abnormal; Notable for the following components:   Glucose, Bld 100 (*)    Calcium 8.7 (*)    All other components within normal limits  URINALYSIS, ROUTINE W REFLEX MICROSCOPIC - Abnormal; Notable for the following components:   Hgb urine dipstick SMALL (*)    Leukocytes, UA SMALL (*)    All other components within normal limits  LIPASE, BLOOD  PREGNANCY, URINE    EKG None  Radiology No results found.  Procedures Procedures (including critical care time)  Medications Ordered in ED Medications  sodium chloride 0.9 % bolus 500 mL (0 mLs Intravenous Stopped 04/30/18 1306)  iopamidol (ISOVUE-300) 61 % injection 100 mL (100 mLs Intravenous Contrast Given 04/30/18 1214)  oxyCODONE-acetaminophen (PERCOCET/ROXICET) 5-325 MG per tablet 1 tablet (1 tablet Oral Given 04/30/18 1659)     Initial Impression / Assessment and Plan / ED Course  I have reviewed the triage vital signs and the nursing notes.  Pertinent labs & imaging results that were available during my care of the patient were reviewed by me and considered in my medical decision making (see chart for details).    Patient presents with persistent left mid abdominal pain.  Discussed differential diagnosis and with worsening symptoms plan for blood work, CT scan to look for signs of diverticulitis/kidney stone/other etiology.  Pain meds ordered.  CT scan results reviewed no acute findings.  Pain improved on recheck.  Patient is still having pain and no definitive cause and since this is new for patient plan for ultrasound to look for signs of cyst/torsion. Signed out to fup results.  Blood work no acute changes.    Final Clinical Impressions(s) / ED Diagnoses   Final diagnoses:    Intractable left lower quadrant abdominal pain  Left lower quadrant pain    ED Discharge Orders         Ordered    oxyCODONE-acetaminophen (PERCOCET) 5-325 MG tablet  Every 8 hours PRN     04/30/18 1638           Blane OharaZavitz, Bridgitte Felicetti, MD 05/08/18 0101

## 2018-05-01 ENCOUNTER — Other Ambulatory Visit: Payer: Self-pay | Admitting: Obstetrics

## 2018-05-01 DIAGNOSIS — R109 Unspecified abdominal pain: Secondary | ICD-10-CM | POA: Diagnosis not present

## 2018-05-01 DIAGNOSIS — Z3042 Encounter for surveillance of injectable contraceptive: Secondary | ICD-10-CM | POA: Diagnosis not present

## 2018-05-01 DIAGNOSIS — N760 Acute vaginitis: Secondary | ICD-10-CM | POA: Diagnosis not present

## 2018-05-01 DIAGNOSIS — Z118 Encounter for screening for other infectious and parasitic diseases: Secondary | ICD-10-CM | POA: Diagnosis not present

## 2018-05-18 DIAGNOSIS — Z30432 Encounter for removal of intrauterine contraceptive device: Secondary | ICD-10-CM | POA: Diagnosis not present

## 2018-05-18 DIAGNOSIS — R109 Unspecified abdominal pain: Secondary | ICD-10-CM | POA: Diagnosis not present

## 2018-05-18 DIAGNOSIS — Z3200 Encounter for pregnancy test, result unknown: Secondary | ICD-10-CM | POA: Diagnosis not present

## 2018-05-21 DIAGNOSIS — R109 Unspecified abdominal pain: Secondary | ICD-10-CM | POA: Diagnosis not present

## 2018-05-21 DIAGNOSIS — R5383 Other fatigue: Secondary | ICD-10-CM | POA: Diagnosis not present

## 2018-06-19 DIAGNOSIS — Z6822 Body mass index (BMI) 22.0-22.9, adult: Secondary | ICD-10-CM | POA: Diagnosis not present

## 2018-06-19 DIAGNOSIS — J039 Acute tonsillitis, unspecified: Secondary | ICD-10-CM | POA: Diagnosis not present

## 2018-07-05 ENCOUNTER — Ambulatory Visit (INDEPENDENT_AMBULATORY_CARE_PROVIDER_SITE_OTHER): Payer: BLUE CROSS/BLUE SHIELD | Admitting: Otolaryngology

## 2018-07-05 DIAGNOSIS — J351 Hypertrophy of tonsils: Secondary | ICD-10-CM | POA: Diagnosis not present

## 2018-07-05 DIAGNOSIS — K219 Gastro-esophageal reflux disease without esophagitis: Secondary | ICD-10-CM

## 2018-07-05 DIAGNOSIS — R070 Pain in throat: Secondary | ICD-10-CM | POA: Diagnosis not present

## 2018-07-12 ENCOUNTER — Other Ambulatory Visit: Payer: Self-pay | Admitting: Otolaryngology

## 2018-08-06 ENCOUNTER — Other Ambulatory Visit: Payer: Self-pay

## 2018-08-06 ENCOUNTER — Encounter (HOSPITAL_BASED_OUTPATIENT_CLINIC_OR_DEPARTMENT_OTHER): Payer: Self-pay

## 2018-08-13 ENCOUNTER — Ambulatory Visit (HOSPITAL_BASED_OUTPATIENT_CLINIC_OR_DEPARTMENT_OTHER): Payer: BLUE CROSS/BLUE SHIELD | Admitting: Anesthesiology

## 2018-08-13 ENCOUNTER — Encounter (HOSPITAL_BASED_OUTPATIENT_CLINIC_OR_DEPARTMENT_OTHER)
Admission: RE | Disposition: A | Discharge status: Home / Self Care | Payer: Self-pay | Source: Ambulatory Visit | Attending: Otolaryngology

## 2018-08-13 ENCOUNTER — Other Ambulatory Visit: Payer: Self-pay

## 2018-08-13 ENCOUNTER — Ambulatory Visit (HOSPITAL_BASED_OUTPATIENT_CLINIC_OR_DEPARTMENT_OTHER)
Admission: RE | Admit: 2018-08-13 | Discharge: 2018-08-13 | Disposition: A | Discharge status: Home / Self Care | Payer: BLUE CROSS/BLUE SHIELD | Source: Ambulatory Visit | Attending: Otolaryngology | Admitting: Otolaryngology

## 2018-08-13 ENCOUNTER — Encounter (HOSPITAL_BASED_OUTPATIENT_CLINIC_OR_DEPARTMENT_OTHER): Payer: Self-pay | Admitting: *Deleted

## 2018-08-13 DIAGNOSIS — F17210 Nicotine dependence, cigarettes, uncomplicated: Secondary | ICD-10-CM | POA: Insufficient documentation

## 2018-08-13 DIAGNOSIS — R599 Enlarged lymph nodes, unspecified: Secondary | ICD-10-CM | POA: Insufficient documentation

## 2018-08-13 DIAGNOSIS — J351 Hypertrophy of tonsils: Secondary | ICD-10-CM | POA: Diagnosis not present

## 2018-08-13 DIAGNOSIS — J3501 Chronic tonsillitis: Secondary | ICD-10-CM | POA: Diagnosis not present

## 2018-08-13 DIAGNOSIS — F329 Major depressive disorder, single episode, unspecified: Secondary | ICD-10-CM | POA: Insufficient documentation

## 2018-08-13 DIAGNOSIS — J358 Other chronic diseases of tonsils and adenoids: Secondary | ICD-10-CM | POA: Diagnosis not present

## 2018-08-13 HISTORY — PX: TONSILLECTOMY AND ADENOIDECTOMY: SHX28

## 2018-08-13 HISTORY — DX: Unspecified ovarian cyst, unspecified side: N83.209

## 2018-08-13 LAB — POCT PREGNANCY, URINE: PREG TEST UR: NEGATIVE

## 2018-08-13 SURGERY — TONSILLECTOMY AND ADENOIDECTOMY
Anesthesia: General | Site: Mouth

## 2018-08-13 MED ORDER — ROCURONIUM BROMIDE 100 MG/10ML IV SOLN
INTRAVENOUS | Status: DC | PRN
  Administered 2018-08-13: 40 mg via INTRAVENOUS

## 2018-08-13 MED ORDER — OXYCODONE HCL 5 MG PO TABS: 5.0000 mg | ORAL_TABLET | Freq: Once | ORAL | Status: AC | PRN

## 2018-08-13 MED ORDER — ROCURONIUM BROMIDE 50 MG/5ML IV SOSY
PREFILLED_SYRINGE | INTRAVENOUS | Status: AC
  Filled 2018-08-13: qty 5

## 2018-08-13 MED ORDER — PROPOFOL 10 MG/ML IV BOLUS
INTRAVENOUS | Status: AC
  Filled 2018-08-13: qty 20

## 2018-08-13 MED ORDER — FENTANYL CITRATE (PF) 100 MCG/2ML IJ SOLN
INTRAMUSCULAR | Status: AC
  Filled 2018-08-13: qty 2

## 2018-08-13 MED ORDER — AMOXICILLIN 400 MG/5ML PO SUSR: 800.0000 mg | Freq: Two times a day (BID) | ORAL | 0 refills | Status: AC

## 2018-08-13 MED ORDER — MIDAZOLAM HCL 2 MG/2ML IJ SOLN
1.0000 mg | INTRAMUSCULAR | Status: DC | PRN
  Administered 2018-08-13: 2 mg via INTRAVENOUS

## 2018-08-13 MED ORDER — ONDANSETRON HCL 4 MG/2ML IJ SOLN
INTRAMUSCULAR | Status: AC
  Filled 2018-08-13: qty 2

## 2018-08-13 MED ORDER — DEXAMETHASONE SODIUM PHOSPHATE 4 MG/ML IJ SOLN
INTRAMUSCULAR | Status: DC | PRN
  Administered 2018-08-13: 10 mg via INTRAVENOUS

## 2018-08-13 MED ORDER — ACETAMINOPHEN 10 MG/ML IV SOLN: 1000.0000 mg | Freq: Once | INTRAVENOUS | Status: DC | PRN

## 2018-08-13 MED ORDER — OXYCODONE HCL 5 MG/5ML PO SOLN: 5.0000 mg | ORAL | 0 refills | Status: AC | PRN

## 2018-08-13 MED ORDER — SCOPOLAMINE 1 MG/3DAYS TD PT72: 1.0000 | MEDICATED_PATCH | Freq: Once | TRANSDERMAL | Status: DC

## 2018-08-13 MED ORDER — DEXAMETHASONE SODIUM PHOSPHATE 10 MG/ML IJ SOLN
INTRAMUSCULAR | Status: AC
  Filled 2018-08-13: qty 1

## 2018-08-13 MED ORDER — LIDOCAINE 2% (20 MG/ML) 5 ML SYRINGE
INTRAMUSCULAR | Status: AC
  Filled 2018-08-13: qty 5

## 2018-08-13 MED ORDER — SCOPOLAMINE 1 MG/3DAYS TD PT72
MEDICATED_PATCH | TRANSDERMAL | Status: AC
  Filled 2018-08-13: qty 1

## 2018-08-13 MED ORDER — OXYMETAZOLINE HCL 0.05 % NA SOLN
NASAL | Status: AC
  Filled 2018-08-13: qty 15

## 2018-08-13 MED ORDER — PROMETHAZINE HCL 25 MG/ML IJ SOLN: 6.2500 mg | INTRAMUSCULAR | Status: DC | PRN

## 2018-08-13 MED ORDER — OXYCODONE HCL 5 MG/5ML PO SOLN
5.0000 mg | Freq: Once | ORAL | Status: AC | PRN
  Administered 2018-08-13: 5 mg via ORAL

## 2018-08-13 MED ORDER — SUGAMMADEX SODIUM 500 MG/5ML IV SOLN
INTRAVENOUS | Status: DC | PRN
  Administered 2018-08-13: 300 mg via INTRAVENOUS

## 2018-08-13 MED ORDER — FENTANYL CITRATE (PF) 100 MCG/2ML IJ SOLN
50.0000 ug | INTRAMUSCULAR | Status: DC | PRN
  Administered 2018-08-13: 100 ug via INTRAVENOUS

## 2018-08-13 MED ORDER — MIDAZOLAM HCL 2 MG/2ML IJ SOLN
INTRAMUSCULAR | Status: AC
  Filled 2018-08-13: qty 2

## 2018-08-13 MED ORDER — SUGAMMADEX SODIUM 500 MG/5ML IV SOLN
INTRAVENOUS | Status: AC
  Filled 2018-08-13: qty 5

## 2018-08-13 MED ORDER — OXYCODONE HCL 5 MG/5ML PO SOLN
ORAL | Status: AC
  Filled 2018-08-13: qty 5

## 2018-08-13 MED ORDER — ONDANSETRON HCL 4 MG/2ML IJ SOLN
INTRAMUSCULAR | Status: DC | PRN
  Administered 2018-08-13: 4 mg via INTRAVENOUS

## 2018-08-13 MED ORDER — DEXMEDETOMIDINE HCL IN NACL 200 MCG/50ML IV SOLN
INTRAVENOUS | Status: DC | PRN
  Administered 2018-08-13 (×2): 8 ug via INTRAVENOUS

## 2018-08-13 MED ORDER — LACTATED RINGERS IV SOLN
INTRAVENOUS | Status: DC
  Administered 2018-08-13 (×2): via INTRAVENOUS

## 2018-08-13 MED ORDER — LIDOCAINE HCL (CARDIAC) PF 100 MG/5ML IV SOSY
PREFILLED_SYRINGE | INTRAVENOUS | Status: DC | PRN
  Administered 2018-08-13: 100 mg via INTRAVENOUS

## 2018-08-13 MED ORDER — FENTANYL CITRATE (PF) 100 MCG/2ML IJ SOLN
25.0000 ug | INTRAMUSCULAR | Status: DC | PRN
  Administered 2018-08-13 (×2): 50 ug via INTRAVENOUS

## 2018-08-13 MED ORDER — PROPOFOL 10 MG/ML IV BOLUS
INTRAVENOUS | Status: DC | PRN
  Administered 2018-08-13: 50 mg via INTRAVENOUS
  Administered 2018-08-13: 150 mg via INTRAVENOUS

## 2018-08-13 MED ORDER — SCOPOLAMINE 1 MG/3DAYS TD PT72
1.0000 | MEDICATED_PATCH | Freq: Once | TRANSDERMAL | Status: DC | PRN
  Administered 2018-08-13: 1.5 mg via TRANSDERMAL

## 2018-08-13 MED ORDER — ACETAMINOPHEN 500 MG PO TABS
1000.0000 mg | ORAL_TABLET | Freq: Once | ORAL | Status: AC
  Administered 2018-08-13: 1000 mg via ORAL

## 2018-08-13 MED ORDER — ACETAMINOPHEN 500 MG PO TABS
ORAL_TABLET | ORAL | Status: AC
  Filled 2018-08-13: qty 2

## 2018-08-13 SURGICAL SUPPLY — 34 items
BANDAGE COBAN STERILE 2 (GAUZE/BANDAGES/DRESSINGS) IMPLANT
CANISTER SUCT 1200ML W/VALVE (MISCELLANEOUS) ×3 IMPLANT
CATH ROBINSON RED A/P 10FR (CATHETERS) IMPLANT
CATH ROBINSON RED A/P 14FR (CATHETERS) ×2 IMPLANT
COAGULATOR SUCT 6 FR SWTCH (ELECTROSURGICAL) ×1
COAGULATOR SUCT SWTCH 10FR 6 (ELECTROSURGICAL) ×1 IMPLANT
COVER BACK TABLE 60X90IN (DRAPES) ×3 IMPLANT
COVER MAYO STAND STRL (DRAPES) ×3 IMPLANT
COVER WAND RF STERILE (DRAPES) IMPLANT
ELECT REM PT RETURN 9FT ADLT (ELECTROSURGICAL) ×3
ELECT REM PT RETURN 9FT PED (ELECTROSURGICAL)
ELECTRODE REM PT RETRN 9FT PED (ELECTROSURGICAL) IMPLANT
ELECTRODE REM PT RTRN 9FT ADLT (ELECTROSURGICAL) IMPLANT
GAUZE SPONGE 4X4 12PLY STRL LF (GAUZE/BANDAGES/DRESSINGS) ×3 IMPLANT
GLOVE BIO SURGEON STRL SZ 6.5 (GLOVE) ×1 IMPLANT
GLOVE BIO SURGEON STRL SZ7.5 (GLOVE) ×3 IMPLANT
GLOVE BIO SURGEONS STRL SZ 6.5 (GLOVE) ×1
GOWN STRL REUS W/ TWL LRG LVL3 (GOWN DISPOSABLE) ×2 IMPLANT
GOWN STRL REUS W/TWL LRG LVL3 (GOWN DISPOSABLE) ×6
IV NS 500ML (IV SOLUTION) ×3
IV NS 500ML BAXH (IV SOLUTION) ×1 IMPLANT
MARKER SKIN DUAL TIP RULER LAB (MISCELLANEOUS) ×2 IMPLANT
NS IRRIG 1000ML POUR BTL (IV SOLUTION) ×3 IMPLANT
SHEET MEDIUM DRAPE 40X70 STRL (DRAPES) ×3 IMPLANT
SOLUTION BUTLER CLEAR DIP (MISCELLANEOUS) ×3 IMPLANT
SPONGE TONSIL TAPE 1 RFD (DISPOSABLE) IMPLANT
SPONGE TONSIL TAPE 1.25 RFD (DISPOSABLE) ×2 IMPLANT
SYR BULB 3OZ (MISCELLANEOUS) IMPLANT
TOWEL GREEN STERILE FF (TOWEL DISPOSABLE) ×3 IMPLANT
TUBE CONNECTING 20'X1/4 (TUBING) ×1
TUBE CONNECTING 20X1/4 (TUBING) ×2 IMPLANT
TUBE SALEM SUMP 12R W/ARV (TUBING) IMPLANT
TUBE SALEM SUMP 16 FR W/ARV (TUBING) ×2 IMPLANT
WAND COBLATOR 70 EVAC XTRA (SURGICAL WAND) ×3 IMPLANT

## 2018-08-13 NOTE — Op Note (Signed)
DATE OF PROCEDURE:  08/13/2018                              OPERATIVE REPORT  SURGEON:  Newman PiesSu Reiley Keisler, MD  PREOPERATIVE DIAGNOSES: 1. Tonsillar hypertrophy. 2. Chronic tonsillitis. 3. Left tonsillar lesion.  POSTOPERATIVE DIAGNOSES: 1. Tonsillar hypertrophy. 2. Chronic tonsillitis. 3. Left tonsillar lesion.  PROCEDURE PERFORMED:  Tonsillectomy.  ANESTHESIA:  General endotracheal tube anesthesia.  COMPLICATIONS:  None.  ESTIMATED BLOOD LOSS:  Minimal.  INDICATION FOR PROCEDURE:  Carolyn Roach is a 32 y.o. female with a history of recurrent tonsillitis and a left tonsillar lesion (cyst).  According to the patient, she has been experiencing chronic throat discomfort for several years. The patient continues to be symptomatic despite medical treatments. Based on the above findings, the decision was made for the patient to undergo the tonsillectomy procedure. Likelihood of success in reducing symptoms was also discussed.  The risks, benefits, alternatives, and details of the procedure were discussed with the patient.  Questions were invited and answered.  Informed consent was obtained.  DESCRIPTION:  The patient was taken to the operating room and placed supine on the operating table.  General endotracheal tube anesthesia was administered by the anesthesiologist.  The patient was positioned and prepped and draped in a standard fashion for adenotonsillectomy.  A Crowe-Davis mouth gag was inserted into the oral cavity for exposure. 2+ cryptic tonsils were noted bilaterally.  No bifidity was noted.  Indirect mirror examination of the nasopharynx revealed no adenoid hypertrophy. The right tonsil was grasped with a straight Allis clamp and retracted medially.  It was resected free from the underlying pharyngeal constrictor muscles with the Coblator device.  The same procedure was repeated on the left side without exception.  The surgical sites were copiously irrigated.  The mouth gag was removed.  The  care of the patient was turned over to the anesthesiologist.  The patient was awakened from anesthesia without difficulty.  The patient was extubated and transferred to the recovery room in good condition.  OPERATIVE FINDINGS:  Tonsillar hypertrophy.  SPECIMEN:  Bilateral tonsils  FOLLOWUP CARE:  The patient will be discharged home once awake and alert.  She will be placed on amoxicillin 800 mg p.o. b.i.d. for 5 days, and oxycodone 5-5810ml po q 4 hours for postop pain control.   The patient will follow up in my office in approximately 2 weeks.  Carolyn Roach 08/13/2018 10:32 AM

## 2018-08-13 NOTE — Anesthesia Procedure Notes (Signed)
Procedure Name: Intubation Performed by: Verita Lamb, CRNA Pre-anesthesia Checklist: Emergency Drugs available, Suction available, Patient being monitored, Patient identified and Timeout performed Patient Re-evaluated:Patient Re-evaluated prior to induction Oxygen Delivery Method: Circle system utilized Preoxygenation: Pre-oxygenation with 100% oxygen Induction Type: IV induction Ventilation: Mask ventilation without difficulty Laryngoscope Size: Mac and 3 Grade View: Grade I Tube type: Oral Tube size: 7.0 mm Number of attempts: 1 Airway Equipment and Method: Stylet Placement Confirmation: ETT inserted through vocal cords under direct vision,  positive ETCO2,  CO2 detector and breath sounds checked- equal and bilateral Secured at: 22 cm Tube secured with: Tape Dental Injury: Teeth and Oropharynx as per pre-operative assessment

## 2018-08-13 NOTE — Discharge Instructions (Addendum)
[facility] M[facility] °Postoperative Instructions for Tonsillectomy & Adenoidectomy (T&A) °Activity °Restrict activity at home for the first two days, resting as much as possible. Light indoor activity is best. You may usually return to school or work within a week but void strenuous activity and sports for two weeks. Sleep with your head elevated on 2-3 pillows for 3-4 days to help decrease swelling. °Diet °Due to tissue swelling and throat discomfort, you may have little desire to drink for several days. However fluids are very important to prevent dehydration. You will find that non-acidic juices, soups, popsicles, Jell-O, custard, puddings, and any soft or mashed foods taken in small quantities can be swallowed fairly easily. Try to increase your fluid and food intake as the discomfort subsides. It is recommended that a child receive 1-1/2 quarts of fluid in a 24-hour period. Adult require twice this amount.  °Discomfort °Your sore throat may be relieved by applying an ice collar to your neck and/or by taking Tylenol®. You may experience an earache, which is due to referred pain from the throat. Referred ear pain is commonly felt at night when trying to rest. ° ° °Post Anesthesia Home Care Instructions ° °Activity: °Get plenty of rest for the remainder of the day. A responsible individual must stay with you for 24 hours following the procedure.  °For the next 24 hours, DO NOT: °-Drive a car °-Operate machinery °-Drink alcoholic beverages °-Take any medication unless instructed by your physician °-Make any legal decisions or sign important papers. ° °Meals: °Start with liquid foods such as gelatin or soup. Progress to regular foods as tolerated. Avoid greasy, spicy, heavy foods. If nausea and/or vomiting occur, drink only clear liquids until the nausea and/or vomiting subsides. Call your physician if vomiting continues. ° °Special Instructions/Symptoms: °Your throat may feel dry or sore from the anesthesia or  the breathing tube placed in your throat during surgery. If this causes discomfort, gargle with warm salt water. The discomfort should disappear within 24 hours. ° °If you had a scopolamine patch placed behind your ear for the management of post- operative nausea and/or vomiting: ° °1. The medication in the patch is effective for 72 hours, after which it should be removed.  Wrap patch in a tissue and discard in the trash. Wash hands thoroughly with soap and water. °2. You may remove the patch earlier than 72 hours if you experience unpleasant side effects which may include dry mouth, dizziness or visual disturbances. °3. Avoid touching the patch. Wash your hands with soap and water after contact with the patch. °   ° ° °Bleeding                        Although rare, there is risk of having some bleeding during the first 2 weeks after having a T&A. This usually happens between days 7-10 postoperatively. If you or your child should have any bleeding, try to remain calm. We recommend sitting up quietly in a chair and gently spitting out the blood into a bowl. For adults, gargling gently with ice water may help. If the bleeding does not stop after a short time (5 minutes), is more than 1 teaspoonful, or if you become worried, please call our office at [phone] or go directly to the nearest hospital emergency room. Do not eat or drink anything prior to going to the hospital as you may need to be taken to the operating room in order to control   bleeding. GENERAL CONSIDERATIONS 1. Brush your teeth regularly. Avoid mouthwashes and gargles for three weeks. You may gargle gently with warm salt-water as necessary or spray with Chloraseptic. You may make salt-water by placing 2 teaspoons of table salt into a quart of fresh water. Warm the salt-water in a microwave to a luke warm temperature.  2. Avoid exposure to colds and upper respiratory infections if possible.  3. If you look into a mirror or into your child's  mouth, you will see white-gray patches in the back of the throat. This is normal after having a T&A and is like a scab that forms on the skin after an abrasion. It will disappear once the back of the throat heals completely. However, it may cause a noticeable odor; this too will disappear with time. Again, warm salt-water gargles may be used to help keep the throat clean and promote healing.  4. You may notice a temporary change in voice quality, such as a higher pitched voice or a nasal sound, until healing is complete. This may last for 1-2 weeks and should resolve.  5. Do not take or give you child any medications that we have not prescribed or recommended.  6. Snoring may occur, especially at night, for the first week after a T&A. It is due to swelling of the soft palate and will usually resolve.  Please call our office at [phone] if you have any questions.     Post Anesthesia Home Care Instructions  Activity: Get plenty of rest for the remainder of the day. A responsible individual must stay with you for 24 hours following the procedure.  For the next 24 hours, DO NOT: -Drive a car -Advertising copywriterperate machinery -Drink alcoholic beverages -Take any medication unless instructed by your physician -Make any legal decisions or sign important papers.  Meals: Start with liquid foods such as gelatin or soup. Progress to regular foods as tolerated. Avoid greasy, spicy, heavy foods. If nausea and/or vomiting occur, drink only clear liquids until the nausea and/or vomiting subsides. Call your physician if vomiting continues.  Special Instructions/Symptoms: Your throat may feel dry or sore from the anesthesia or the breathing tube placed in your throat during surgery. If this causes discomfort, gargle with warm salt water. The discomfort should disappear within 24 hours.  If you had a scopolamine patch placed behind your ear for the management of post- operative nausea and/or vomiting:  1. The  medication in the patch is effective for 72 hours, after which it should be removed.  Wrap patch in a tissue and discard in the trash. Wash hands thoroughly with soap and water. 2. You may remove the patch earlier than 72 hours if you experience unpleasant side effects which may include dry mouth, dizziness or visual disturbances. 3. Avoid touching the patch. Wash your hands with soap and water after contact with the patch.

## 2018-08-13 NOTE — Anesthesia Postprocedure Evaluation (Signed)
Anesthesia Post Note  Patient: Carolyn BarreHannah H Matzek  Procedure(s) Performed: TONSILLECTOMY (N/A Mouth)     Patient location during evaluation: PACU Anesthesia Type: General Level of consciousness: awake and alert, awake and oriented Pain management: pain level controlled Vital Signs Assessment: post-procedure vital signs reviewed and stable Respiratory status: spontaneous breathing, nonlabored ventilation and respiratory function stable Cardiovascular status: blood pressure returned to baseline and stable Postop Assessment: no apparent nausea or vomiting Anesthetic complications: no    Last Vitals:  Vitals:   08/13/18 1115 08/13/18 1137  BP: 126/77 124/71  Pulse: (!) 53 61  Resp: 10 16  Temp:  36.4 C  SpO2: 100% 100%    Last Pain:  Vitals:   08/13/18 1137  TempSrc:   PainSc: 4                  Cecile HearingStephen Edward Jahzaria Vary

## 2018-08-13 NOTE — Anesthesia Preprocedure Evaluation (Addendum)
Anesthesia Evaluation  Patient identified by MRN, date of birth, ID band Patient awake    Reviewed: Allergy & Precautions, NPO status , Patient's Chart, lab work & pertinent test results  Airway Mallampati: I  TM Distance: >3 FB Neck ROM: Full    Dental  (+) Teeth Intact, Dental Advisory Given   Pulmonary Current Smoker,    Pulmonary exam normal breath sounds clear to auscultation       Cardiovascular Exercise Tolerance: Good negative cardio ROS Normal cardiovascular exam Rhythm:Regular Rate:Normal     Neuro/Psych PSYCHIATRIC DISORDERS Depression negative neurological ROS     GI/Hepatic negative GI ROS, Remote hx of cocaine abuse   Endo/Other  negative endocrine ROS  Renal/GU negative Renal ROS     Musculoskeletal negative musculoskeletal ROS (+)   Abdominal   Peds  Hematology negative hematology ROS (+)   Anesthesia Other Findings Day of surgery medications reviewed with the patient.  Reproductive/Obstetrics                            Anesthesia Physical Anesthesia Plan  ASA: II  Anesthesia Plan: General   Post-op Pain Management:    Induction: Intravenous  PONV Risk Score and Plan: 4 or greater and Treatment may vary due to age or medical condition, Ondansetron, Dexamethasone, Midazolam and Scopolamine patch - Pre-op  Airway Management Planned: Oral ETT  Additional Equipment:   Intra-op Plan:   Post-operative Plan: Extubation in OR  Informed Consent: I have reviewed the patients History and Physical, chart, labs and discussed the procedure including the risks, benefits and alternatives for the proposed anesthesia with the patient or authorized representative who has indicated his/her understanding and acceptance.   Dental advisory given  Plan Discussed with: CRNA  Anesthesia Plan Comments:        Anesthesia Quick Evaluation

## 2018-08-13 NOTE — H&P (Signed)
Cc: Left tonsillar lesion, recurrent sore throat  HPI: The patient is a 32 year old female who presents today complaining of whitish lesion on her left tonsil.  The size of the lesion has increased over the past 2 months.  It is now tender to touch.  In addition, the patient also complains of frequent coughing spells and hoarseness for 2 weeks.  She was treated with amoxicillin for 7 days.  She continues to be symptomatic.  The patient has a history of recurrent strep infections as a child. The frequency of the strep infection has decreased over the past few years.  The patient has been smoking 1 pack per day for the past 15 years.  The patient is very anxious about her tonsillar lesion. She is interested in having the tonsils removed.   The patient's review of systems (constitutional, eyes, ENT, cardiovascular, respiratory, GI, musculoskeletal, skin, neurologic, psychiatric, endocrine, hematologic, allergic) is noted in the ROS questionnaire.  It is reviewed with the patient.  Family health history: None.   Major events: None.   Ongoing medical problems: None.   Social history: The patient is married. She smokes one pack of cigarettes a day. She drinks alcohol on the weekends.   Exam: General: Communicates without difficulty, well nourished, no acute distress. Head: Normocephalic, no evidence injury, no tenderness, facial buttresses intact without stepoff. Face/sinus: No tenderness to palpation and percussion. Facial movement is normal and symmetric. Eyes: PERRL, EOMI. No scleral icterus, conjunctivae clear. Neuro: CN II exam reveals vision grossly intact.  No nystagmus at any point of gaze. Ears: Auricles well formed without lesions.  Ear canals are intact without mass or lesion.  No erythema or edema is appreciated.  The TMs are intact without fluid. Nose: External evaluation reveals normal support and skin without lesions.  Dorsum is intact.  Anterior rhinoscopy reveals healthy pink mucosa over  anterior aspect of inferior turbinates and intact septum.  No purulence noted. Oral: The patient is noted to have a 5 mm inclusion cyst within the superior aspect of the left tonsil. The appearance is suggestive of a mucous inclusion cyst.  No surface ulceration is noted. The left tonsil is mildly erythematous, consistent with chronic inflammation. Neck: Full range of motion without pain.  There is no significant lymphadenopathy.  No masses palpable.  Thyroid bed within normal limits to palpation.  Parotid glands and submandibular glands equal bilaterally without mass.  Trachea is midline. Neuro:  CN 2-12 grossly intact. Gait normal.   Procedure:  Flexible Fiberoptic Laryngoscopy Risks, benefits, and alternatives of flexible endoscopy were explained to the patient.  Specific mention was made of the risk of throat numbness with difficulty swallowing, possible bleeding from the nose and mouth, and pain from the procedure.  The patient gave oral consent to proceed.  The nasal cavities were decongested and anesthetised with a combination of oxymetazoline and 4% lidocaine solution.  The flexible scope was inserted into the right nasal cavity and advanced towards the nasopharynx.  Visualized mucosa over the turbinates and septum were normal.  The nasopharynx was clear.  Oropharyngeal walls were symmetric and mobile without lesion, mass, or edema.  Hypopharynx was also without  lesion or edema.  Larynx was mobile without lesions.  No lesions or asymmetry in the supraglottic larynx.  Arytenoid mucosa was significant edematous with slight erythema.  True vocal folds were pale yellow and edematous but without mass or lesion.  Base of tongue was normal.   Assessment  1.  The patient is noted  to have a 5 mm inclusion cyst within the superior aspect of the left tonsil. The appearance is suggestive of a mucous inclusion cyst.  No surface ulceration is noted.  2.  The left tonsil is mildly erythematous, consistent with  chronic inflammation.  3.  Significant posterior laryngopharyngeal edema and erythema are noted on today's laryngoscopy examination.  This is suggestive of laryngopharyngeal reflux.  No other suspicious mass or lesion is noted.    Plan: 1.  The physical exam findings and laryngoscopy findings are reviewed with the patient.  2.  Clindamycin 300 mg p.o. q.i.d. for 7 days.  3.  Omeprazole 20 mg p.o. q.a.m.  4.  The patient is interested in proceeding within the tonsillectomy procedure.  The risks, benefits, and details of the procedure are reviewed with the patient.  5.  We will schedule the tonsillectomy in accordance with the patient's schedule.

## 2018-08-13 NOTE — Transfer of Care (Signed)
Immediate Anesthesia Transfer of Care Note  Patient: Carolyn Roach  Procedure(s) Performed: TONSILLECTOMY (N/A Mouth)  Patient Location: PACU  Anesthesia Type:General  Level of Consciousness: awake, alert  and oriented  Airway & Oxygen Therapy: Patient Spontanous Breathing and Patient connected to face mask oxygen  Post-op Assessment: Report given to RN and Post -op Vital signs reviewed and stable  Post vital signs: Reviewed and stable  Last Vitals:  Vitals Value Taken Time  BP 133/75 08/13/2018 10:26 AM  Temp    Pulse    Resp 35 08/13/2018 10:28 AM  SpO2    Vitals shown include unvalidated device data.  Last Pain:  Vitals:   08/13/18 0759  TempSrc: Oral  PainSc: 0-No pain         Complications: No apparent anesthesia complications

## 2018-08-14 ENCOUNTER — Encounter (HOSPITAL_BASED_OUTPATIENT_CLINIC_OR_DEPARTMENT_OTHER): Payer: Self-pay | Admitting: Otolaryngology

## 2018-08-30 ENCOUNTER — Ambulatory Visit (INDEPENDENT_AMBULATORY_CARE_PROVIDER_SITE_OTHER): Payer: BLUE CROSS/BLUE SHIELD | Admitting: Otolaryngology

## 2018-12-03 DIAGNOSIS — Z118 Encounter for screening for other infectious and parasitic diseases: Secondary | ICD-10-CM | POA: Diagnosis not present

## 2018-12-03 DIAGNOSIS — Z3009 Encounter for other general counseling and advice on contraception: Secondary | ICD-10-CM | POA: Diagnosis not present

## 2018-12-03 DIAGNOSIS — Z113 Encounter for screening for infections with a predominantly sexual mode of transmission: Secondary | ICD-10-CM | POA: Diagnosis not present

## 2018-12-03 DIAGNOSIS — N760 Acute vaginitis: Secondary | ICD-10-CM | POA: Diagnosis not present

## 2018-12-03 DIAGNOSIS — Z202 Contact with and (suspected) exposure to infections with a predominantly sexual mode of transmission: Secondary | ICD-10-CM | POA: Diagnosis not present

## 2018-12-19 DIAGNOSIS — N898 Other specified noninflammatory disorders of vagina: Secondary | ICD-10-CM | POA: Diagnosis not present

## 2018-12-19 DIAGNOSIS — Z7251 High risk heterosexual behavior: Secondary | ICD-10-CM | POA: Diagnosis not present

## 2018-12-20 DIAGNOSIS — N739 Female pelvic inflammatory disease, unspecified: Secondary | ICD-10-CM | POA: Diagnosis not present

## 2018-12-20 DIAGNOSIS — R109 Unspecified abdominal pain: Secondary | ICD-10-CM | POA: Diagnosis not present

## 2018-12-20 DIAGNOSIS — Z1159 Encounter for screening for other viral diseases: Secondary | ICD-10-CM | POA: Diagnosis not present

## 2018-12-20 DIAGNOSIS — N898 Other specified noninflammatory disorders of vagina: Secondary | ICD-10-CM | POA: Diagnosis not present

## 2018-12-20 DIAGNOSIS — Z3202 Encounter for pregnancy test, result negative: Secondary | ICD-10-CM | POA: Diagnosis not present

## 2018-12-20 DIAGNOSIS — Z114 Encounter for screening for human immunodeficiency virus [HIV]: Secondary | ICD-10-CM | POA: Diagnosis not present

## 2018-12-20 DIAGNOSIS — Z113 Encounter for screening for infections with a predominantly sexual mode of transmission: Secondary | ICD-10-CM | POA: Diagnosis not present

## 2018-12-22 ENCOUNTER — Emergency Department (HOSPITAL_COMMUNITY)
Admission: EM | Admit: 2018-12-22 | Discharge: 2018-12-23 | Disposition: A | Discharge status: Home / Self Care | Payer: BLUE CROSS/BLUE SHIELD | Attending: Emergency Medicine | Admitting: Emergency Medicine

## 2018-12-22 ENCOUNTER — Other Ambulatory Visit: Payer: Self-pay

## 2018-12-22 ENCOUNTER — Encounter (HOSPITAL_COMMUNITY): Payer: Self-pay | Admitting: Emergency Medicine

## 2018-12-22 DIAGNOSIS — Z046 Encounter for general psychiatric examination, requested by authority: Secondary | ICD-10-CM | POA: Diagnosis not present

## 2018-12-22 DIAGNOSIS — F1910 Other psychoactive substance abuse, uncomplicated: Secondary | ICD-10-CM | POA: Insufficient documentation

## 2018-12-22 DIAGNOSIS — F17210 Nicotine dependence, cigarettes, uncomplicated: Secondary | ICD-10-CM | POA: Diagnosis not present

## 2018-12-22 DIAGNOSIS — F329 Major depressive disorder, single episode, unspecified: Secondary | ICD-10-CM | POA: Insufficient documentation

## 2018-12-22 DIAGNOSIS — F1210 Cannabis abuse, uncomplicated: Secondary | ICD-10-CM | POA: Insufficient documentation

## 2018-12-22 DIAGNOSIS — R4689 Other symptoms and signs involving appearance and behavior: Secondary | ICD-10-CM | POA: Diagnosis present

## 2018-12-22 DIAGNOSIS — F1410 Cocaine abuse, uncomplicated: Secondary | ICD-10-CM | POA: Diagnosis not present

## 2018-12-22 DIAGNOSIS — Z79899 Other long term (current) drug therapy: Secondary | ICD-10-CM | POA: Diagnosis not present

## 2018-12-22 DIAGNOSIS — R45851 Suicidal ideations: Secondary | ICD-10-CM | POA: Diagnosis not present

## 2018-12-22 DIAGNOSIS — F1994 Other psychoactive substance use, unspecified with psychoactive substance-induced mood disorder: Secondary | ICD-10-CM | POA: Diagnosis present

## 2018-12-22 LAB — RAPID URINE DRUG SCREEN, HOSP PERFORMED
Amphetamines: NOT DETECTED
BARBITURATES: NOT DETECTED
BENZODIAZEPINES: NOT DETECTED
COCAINE: POSITIVE — AB
Opiates: NOT DETECTED
TETRAHYDROCANNABINOL: POSITIVE — AB

## 2018-12-22 LAB — COMPREHENSIVE METABOLIC PANEL
ALT: 21 U/L (ref 0–44)
ANION GAP: 9 (ref 5–15)
AST: 19 U/L (ref 15–41)
Albumin: 3.9 g/dL (ref 3.5–5.0)
Alkaline Phosphatase: 60 U/L (ref 38–126)
BUN: 14 mg/dL (ref 6–20)
CHLORIDE: 105 mmol/L (ref 98–111)
CO2: 23 mmol/L (ref 22–32)
Calcium: 9.1 mg/dL (ref 8.9–10.3)
Creatinine, Ser: 0.79 mg/dL (ref 0.44–1.00)
Glucose, Bld: 95 mg/dL (ref 70–99)
POTASSIUM: 3.6 mmol/L (ref 3.5–5.1)
SODIUM: 137 mmol/L (ref 135–145)
Total Bilirubin: 0.4 mg/dL (ref 0.3–1.2)
Total Protein: 7.1 g/dL (ref 6.5–8.1)

## 2018-12-22 LAB — CBC WITH DIFFERENTIAL/PLATELET
Abs Immature Granulocytes: 0.07 10*3/uL (ref 0.00–0.07)
BASOS ABS: 0.1 10*3/uL (ref 0.0–0.1)
Basophils Relative: 0 %
EOS PCT: 1 %
Eosinophils Absolute: 0.2 10*3/uL (ref 0.0–0.5)
HEMATOCRIT: 40.2 % (ref 36.0–46.0)
HEMOGLOBIN: 13.6 g/dL (ref 12.0–15.0)
Immature Granulocytes: 1 %
LYMPHS ABS: 3.3 10*3/uL (ref 0.7–4.0)
Lymphocytes Relative: 29 %
MCH: 31.8 pg (ref 26.0–34.0)
MCHC: 33.8 g/dL (ref 30.0–36.0)
MCV: 93.9 fL (ref 80.0–100.0)
MONOS PCT: 7 %
Monocytes Absolute: 0.8 10*3/uL (ref 0.1–1.0)
NEUTROS ABS: 7.1 10*3/uL (ref 1.7–7.7)
NEUTROS PCT: 62 %
NRBC: 0 % (ref 0.0–0.2)
Platelets: 387 10*3/uL (ref 150–400)
RBC: 4.28 MIL/uL (ref 3.87–5.11)
RDW: 13.2 % (ref 11.5–15.5)
WBC: 11.5 10*3/uL — ABNORMAL HIGH (ref 4.0–10.5)

## 2018-12-22 LAB — ETHANOL

## 2018-12-22 MED ORDER — NICOTINE 14 MG/24HR TD PT24
14.0000 mg | MEDICATED_PATCH | Freq: Once | TRANSDERMAL | Status: DC
  Administered 2018-12-22: 14 mg via TRANSDERMAL
  Filled 2018-12-22: qty 1

## 2018-12-22 MED ORDER — ALUM & MAG HYDROXIDE-SIMETH 200-200-20 MG/5ML PO SUSP: 30.0000 mL | Freq: Four times a day (QID) | ORAL | Status: DC | PRN

## 2018-12-22 MED ORDER — ONDANSETRON HCL 4 MG PO TABS: 4.0000 mg | ORAL_TABLET | Freq: Three times a day (TID) | ORAL | Status: DC | PRN

## 2018-12-22 MED ORDER — ZOLPIDEM TARTRATE 5 MG PO TABS
5.0000 mg | ORAL_TABLET | Freq: Every evening | ORAL | Status: DC | PRN
  Administered 2018-12-22: 5 mg via ORAL
  Filled 2018-12-22: qty 1

## 2018-12-22 MED ORDER — ACETAMINOPHEN 325 MG PO TABS: 650.0000 mg | ORAL_TABLET | ORAL | Status: DC | PRN

## 2018-12-22 NOTE — ED Triage Notes (Addendum)
Patient brought to ED by [facility]. Per officer husband filed IVC papers in which are being brought to ED. Per husband officer husband called stating patient wanted to kill herself. Patient denies any SI or HI. Patient states that here and husband are in the process of getting a divorce and husband is trying to get custody of children and propriety. Patient also reports got into verbal fight with husband because she recently went to GYN last week and was diagnosed with gonorrhea in which she states "given to her by her husband."

## 2018-12-22 NOTE — ED Provider Notes (Signed)
[facility] EMERGENCY DEPARTMENT Provider Note   CSN: [id number] Arrival date & time: 12/22/18  1800    History   Chief Complaint Chief Complaint  Patient presents with  . V70.1    HPI Carolyn Roach is a 33 y.o. female.     Pt reports she has seen a lawyer and asked her husband to divorce.  Pt states he is angry at her and took out IVC papers.  Pt denies suicidal or homicidal thoughts.  Pt states she does not have a gun.  Pt reports she has 2 children.  Pt admits to some recent substance abuse.   The history is provided by the patient. No language interpreter was used.  Mental Health Problem  Patient accompanied by:  Conni Elliot enforcement   Past Medical History:  Diagnosis Date  . Cocaine abuse (HCC)    8 years ago, went to rehab  . Depression   . Ruptured ovarian cyst 04/2018  . Vaginal Pap smear, abnormal     Patient Active Problem List   Diagnosis Date Noted  . Postpartum care following vaginal delivery (9/13) 06/08/2016    Past Surgical History:  Procedure Laterality Date  . CERVICAL CONE BIOPSY    . TONSILLECTOMY AND ADENOIDECTOMY N/A 08/13/2018   Procedure: TONSILLECTOMY;  Surgeon: Newman Pies, MD;  Location: [facility];  Service: ENT;  Laterality: N/A;  . WISDOM TOOTH EXTRACTION       OB History    Gravida  4   Para  2   Term  2   Preterm      AB  2   Living  2     SAB  1   TAB  1   Ectopic      Multiple  0   Live Births  2            Home Medications    Prior to Admission medications   Medication Sig Start Date End Date Taking? Authorizing Provider  acetaminophen (TYLENOL) 325 MG tablet Take 650 mg by mouth every 6 (six) hours as needed for mild pain or headache.    Yes [provider]  doxycycline (MONODOX) 100 MG capsule Take 1 capsule by mouth 2 (two) times daily. 12/20/18  Yes [provider]  metroNIDAZOLE (FLAGYL) 500 MG tablet Take 1 tablet by mouth 2 (two) times daily. 12/20/18  Yes [provider]  ibuprofen (ADVIL,MOTRIN) 800 MG tablet Take 1 tablet (800 mg total) by mouth every 6 (six) hours. Patient not taking: Reported on 12/22/2018 06/09/16   Raelyn Mora, CNM  oxyCODONE (ROXICODONE) 5 MG/5ML solution Take 5-10 mLs (5-10 mg total) by mouth every 4 (four) hours as needed for severe pain. Patient not taking: Reported on 12/22/2018 08/13/18   Newman Pies, MD    Family History Family History  Problem Relation Age of Onset  . Hypertension Mother   . Diabetes Maternal Grandmother     Social History Social History   Tobacco Use  . Smoking status: Current Every Day Smoker    Packs/day: 1.00    Types: Cigarettes  . Smokeless tobacco: Never Used  Substance Use Topics  . Alcohol use: Yes    Comment: occassional   . Drug use: Yes    Types: Marijuana, Cocaine     Allergies   Other   Review of Systems Review of Systems  All other systems reviewed and are negative.    Physical Exam Updated Vital Signs BP 118/86 (BP Location:  Right Arm)   Pulse 98   Temp 98.8 F (37.1 C) (Oral)   Resp 20   Ht 5\' 7"  (1.702 m)   Wt 65.8 kg   LMP 12/01/2018   SpO2 100%   BMI 22.71 kg/m   Physical Exam Vitals signs and nursing note reviewed.  Constitutional:      Appearance: She is well-developed.  HENT:     Head: Normocephalic.     Nose: Nose normal.     Mouth/Throat:     Mouth: Mucous membranes are moist.  Eyes:     Pupils: Pupils are equal, round, and reactive to light.  Neck:     Musculoskeletal: Normal range of motion.  Cardiovascular:     Rate and Rhythm: Normal rate.  Pulmonary:     Effort: Pulmonary effort is normal.  Abdominal:     General: Abdomen is flat. There is no distension.  Musculoskeletal: Normal range of motion.  Skin:    General: Skin is warm.  Neurological:     General: No focal deficit present.     Mental Status: She is alert and oriented to person, place, and time.  Psychiatric:        Mood and Affect: Mood normal.      ED  Treatments / Results  Labs (all labs ordered are listed, but only abnormal results are displayed) Labs Reviewed  RAPID URINE DRUG SCREEN, HOSP PERFORMED - Abnormal; Notable for the following components:      Result Value   Cocaine POSITIVE (*)    Tetrahydrocannabinol POSITIVE (*)    All other components within normal limits  CBC WITH DIFFERENTIAL/PLATELET - Abnormal; Notable for the following components:   WBC 11.5 (*)    All other components within normal limits  COMPREHENSIVE METABOLIC PANEL  ETHANOL    EKG None  Radiology No results found.  Procedures Procedures (including critical care time)  Medications Ordered in ED Medications - No data to display   Initial Impression / Assessment and Plan / ED Course  I have reviewed the triage vital signs and the nursing notes.  Pertinent labs & imaging results that were available during my care of the patient were reviewed by me and considered in my medical decision making (see chart for details).        MDM  Pt is positive for cocaine and THC.   TTS assessed pt and is trying to reach husband for information.   TTS Berna Spare reports observation overnight.  Reassess in am.   Final Clinical Impressions(s) / ED Diagnoses   Final diagnoses:  Substance abuse [facility])    ED Discharge Orders    None       Osie Cheeks 12/22/18 2234    Linwood Dibbles, MD 12/23/18 [phone]

## 2018-12-22 NOTE — ED Notes (Signed)
Pt made aware that she was staying overnight. Pt was initially tearful and slightly upset, but calmed down after this RN spoke to her about the process. Pt given second meal

## 2018-12-22 NOTE — ED Notes (Signed)
Faxed IVC paperwork to [facility] @ [phone]

## 2018-12-22 NOTE — BH Assessment (Signed)
Tele Assessment Note   Patient Name: Carolyn Roach MRN: [id number] Referring Physician: Trisha Mangle, PA  Location of Patient: [facility] Location of Provider: Behavioral Health TTS Department  Carolyn Roach is an 33 y.o. female.  -Clinician talked with Trisha Mangle, PA.  She said that patient was brought in on IVC and was petitioned by spouse.  IVC papers allege that patient has had behavior changes and may have been using drugs.  Also alleges that patient said she had purchased a gun yesterday and said she was going to go kill herself with it.  Patient says that earlier in the week she went to see a lawyer about possibly separating from her husband.  She told husband that she had talked with a Clinical research associate.  Patient says husband is a good father to their 74 year old daughter.  Patient says however that he has been unfaithful in their marriage at least four times and she is tired of it.  Patient denies that she has purchased a gun.  She says that she could not if she wanted to because she has two past felony counts in her record.  Patient says she does not have access to a gun anyway.  Patient has no thoughts of killing herself.  She denies any intention or plan to kill self.  She has no HI or A/V hallucinations.  Patient admits that she smoked a blunt that that also had cocaine in it recently, around 03/25.  She says she sometimes will drink ETOH but says that it is not much in amount and is less than 2 times in a month.  Last use of ETOH was three weeks ago.  Patient is pleasant to talk to.  She has good eye contact.  Her answers are coherent and purposeful.  She denies any ongoing depression or anxiety.    Patient says that she could stay with her father for a few days upon discharge.    Patient was at [facility] about 10 years ago.  She has not had any other outpatient or inpatient care since then.  Clinician called husband to gather collateral information but he was unavailable.   -Clinician  discussed patient care with Nira Conn, FNP.  He said that collateral information needed.  Clinician let Trisha Mangle, PA know.  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 Cocaine abuse (HCC)    8 years ago, went to rehab  . Depression   . Ruptured ovarian cyst 04/2018  . Vaginal Pap smear, abnormal     Past Surgical History:  Procedure Laterality Date  . CERVICAL CONE BIOPSY    . TONSILLECTOMY AND ADENOIDECTOMY N/A 08/13/2018   Procedure: TONSILLECTOMY;  Surgeon: Newman Pies, MD;  Location: [facility];  Service: ENT;  Laterality: N/A;  . WISDOM TOOTH EXTRACTION      Family History:  Family History  Problem Relation Age of Onset  . Hypertension Mother   . Diabetes Maternal Grandmother     Social History:  reports that she has been smoking cigarettes. She has been smoking about 1.00 pack per day. She has never used smokeless tobacco. She reports current alcohol use. She reports current drug use. Drugs: Marijuana and Cocaine.  Additional Social History:  Alcohol / Drug Use Pain Medications: See PTA med list  Prescriptions: See PTA medication list Over the Counter: Ibuprophen when needed. History of alcohol / drug use?:  Yes Substance #1 Name of Substance 1: ETOH 1 - Age of First Use: 32 years of age 67 - Amount (size/oz): "not a whole lot" 1 - Frequency: Less than twice in a month 1 - Duration: off and on 1 - Last Use / Amount: Three weeks ago Substance #2 Name of Substance 2: Marijuana 2 - Age of First Use: 33 years of age 34 - Amount (size/oz): Varies 2 - Frequency: Less than once in a month 2 - Duration: off and on 2 - Last Use / Amount: Around 12/19/18 Substance #3 Name of Substance 3: Cocaine 3 - Age of First Use: Teens 3 - Amount (size/oz): Varies 3 - Frequency: Less than once in a month 3 - Duration: off and on 3 - Last Use / Amount: Around 12/19/18.   Smoked a blunt that she knew had cocaine in it.  CIWA: CIWA-Ar BP: 118/86 Pulse Rate: 98 COWS:    Allergies:  Allergies  Allergen Reactions  . Other Anaphylaxis and Other (See Comments)    Pt states that she is allergic to curry.      Home Medications: (Not in a hospital admission)   OB/GYN Status:  Patient's last menstrual period was 12/01/2018.  General Assessment Data Location of Assessment: [facility] TTS Assessment: In system Is this a Tele or Face-to-Face Assessment?: Tele Assessment Is this an Initial Assessment or a Re-assessment for this encounter?: Initial Assessment Patient Accompanied by:: N/A Language Other than English: No Living Arrangements: Other (Comment)(with husband) What gender do you identify as?: Female Marital status: Married Welch name: Carolyn Roach Pregnancy Status: No Living Arrangements: Spouse/significant other Can pt return to current living arrangement?: Yes Admission Status: Involuntary Petitioner: Family member Is patient capable of signing voluntary admission?: No Referral Source: Self/Family/Friend Insurance type: BC/BS     Crisis Care Plan Living Arrangements: Spouse/significant other Name of Psychiatrist: None Name of Therapist: None  Education Status Is patient currently in school?: No Is the patient employed, unemployed or receiving disability?: Employed  Risk to self with the past 6 months Suicidal Ideation: No Has patient been a risk to self within the past 6 months prior to admission? : No Suicidal Intent: No Has patient had any suicidal intent within the past 6 months prior to admission? : No Is patient at risk for suicide?: No Suicidal Plan?: No(Petitioner alleges she may have a gun.) Has patient had any suicidal plan within the past 6 months prior to admission? : No Access to Means: Yes Specify Access to Suicidal Means: Petitioner reports she may have a gun. What has been your use of drugs/alcohol within the last 12  months?: Cocaine & THC Previous Attempts/Gestures: No How many times?: 0 Other Self Harm Risks: None Triggers for Past Attempts: None known Intentional Self Injurious Behavior: None Family Suicide History: No Recent stressful life event(s): Conflict (Comment), Job Loss(Argument with husband; Laid off from work) Persecutory voices/beliefs?: No Depression: No Depression Symptoms: (Pt denies depressive symptoms) Substance abuse history and/or treatment for substance abuse?: Yes(Was in [place] in 2009) Suicide prevention information given to non-admitted patients: Not applicable  Risk to Others within the past 6 months Homicidal Ideation: No Does patient have any lifetime risk of violence toward others beyond the six months prior to admission? : No Thoughts of Harm to Others: No Current Homicidal Intent: No Current Homicidal Plan: No Access to Homicidal Means: No Identified Victim: No one History of harm to others?: No Assessment of Violence: None Noted Violent Behavior Description: (Pt denies) Does  patient have access to weapons?: No(Husband alleges that she may have a gun.) Criminal Charges Pending?: No Does patient have a court date: No Is patient on probation?: No  Psychosis Hallucinations: None noted Delusions: None noted  Mental Status Report Appearance/Hygiene: Unremarkable, In scrubs Eye Contact: Good Motor Activity: Freedom of movement, Unremarkable Speech: Logical/coherent Level of Consciousness: Alert Mood: Anxious, Pleasant Affect: Appropriate to circumstance Anxiety Level: Minimal Thought Processes: Coherent, Relevant Judgement: Unimpaired Orientation: Person, Place, Situation Obsessive Compulsive Thoughts/Behaviors: None  Cognitive Functioning Concentration: Normal Memory: Recent Impaired, Remote Intact Is patient IDD: No Insight: Fair Impulse Control: Fair Appetite: Good Have you had any weight changes? : No Change Sleep: No Change Total Hours of  Sleep: (Has a 35 year old, sleep varies.) Vegetative Symptoms: None  ADLScreening [facility] Assessment Services) Patient's cognitive ability adequate to safely complete daily activities?: Yes Patient able to express need for assistance with ADLs?: Yes Independently performs ADLs?: Yes (appropriate for developmental age)  Prior Inpatient Therapy Prior Inpatient Therapy: Yes Prior Therapy Dates: 2009 Prior Therapy Facilty/Provider(s): [facility] program Reason for Treatment: SA  Prior Outpatient Therapy Prior Outpatient Therapy: Yes Prior Therapy Dates: 2009  Prior Therapy Facilty/Provider(s): [facility] Reason for Treatment: program related Does patient have an ACCT team?: No Does patient have Intensive In-House Services?  : No Does patient have [facility] services? : No Does patient have P4CC services?: No  ADL Screening (condition at time of admission) Patient's cognitive ability adequate to safely complete daily activities?: Yes Is the patient deaf or have difficulty hearing?: No Does the patient have difficulty seeing, even when wearing glasses/contacts?: No Does the patient have difficulty concentrating, remembering, or making decisions?: No Patient able to express need for assistance with ADLs?: Yes Does the patient have difficulty dressing or bathing?: No Independently performs ADLs?: Yes (appropriate for developmental age) Does the patient have difficulty walking or climbing stairs?: No Weakness of Legs: None Weakness of Arms/Hands: None       Abuse/Neglect Assessment (Assessment to be complete while patient is alone) Abuse/Neglect Assessment Can Be Completed: Yes Physical Abuse: Denies Verbal Abuse: Denies Sexual Abuse: Denies Exploitation of patient/patient's resources: Denies Self-Neglect: Denies     Merchant navy officer (For Healthcare) Does Patient Have a Medical Advance Directive?: No Type of Advance Directive: Healthcare Power of Attorney, Living will Would patient like  information on creating a medical advance directive?: No - Patient declined          Disposition:  Disposition Initial Assessment Completed for this Encounter: Yes Patient referred to: Other (Comment)(Need collateral information)  This service was provided via telemedicine using a 2-way, interactive audio and video technology.  Names of all persons participating in this telemedicine service and their role in this encounter. Name: Catheline Bjornsen Role: patient  Name: Beatriz Stallion, M.S. LCAS QP Role: clinician  Name:  Role:   Name:  Role:     [place] Lodge 12/22/2018 8:19 PM

## 2018-12-22 NOTE — BH Assessment (Signed)
[facility] Assessment Progress Note   At 20:42 clinician received a return call from husband Dealer).  He said that patient's behavior has been changing over the last month.  He reports that he has found drug paraphenalia in her car in the last few weeks.  He reports that she has not taken her 35 year old daughter to school (before the schools shut down due to COVID 19) a couple times because she could not wake up.  Husband said that she had told him that she had purchased a gun and she was going to kill herself today.  Husband said that she left the house.  He called her brother (his brother in law) and told him what she did.  Brother in Social worker and mother in law came to husband's house and they went to look for patient while husband went to Therapist, occupational.  Husband said he did have guns secured in the house but he has given them to his brother in Social worker for safekeeping.    Husband said that patient was found by one of the [facility].  They then brought her to [facility].  Husband said that patient told him two weeks ago that she was going to SA treatment at [facility] in [place].  He said she called him twice a day.  He is dubious of whether she went there because he has not seen any paperwork from there and there has yet to be an insurance claim from them. (Patient had denied being in any kind of tx facility since being in [facility] 10 years ago)   Clinician asked if she had told him that she had contacted a lawyer about separation.  He said that they had been talking about it for the last couple of weeks.    Husband said he is worried about the safety of the two children along with her safety.

## 2018-12-22 NOTE — BH Assessment (Signed)
[facility] Assessment Progress Note    Clinician discussed information given by husband with Nira Conn, FNP.  He recommended patient be observed overnight and seen by psychiatry for an AM psych eval.  First opinion will need to be completed.  Clinician informed Trisha Mangle, PA of disposition.

## 2018-12-22 NOTE — ED Notes (Signed)
Pt given meal and drink ? ?

## 2018-12-23 DIAGNOSIS — F1910 Other psychoactive substance abuse, uncomplicated: Secondary | ICD-10-CM

## 2018-12-23 DIAGNOSIS — F1994 Other psychoactive substance use, unspecified with psychoactive substance-induced mood disorder: Secondary | ICD-10-CM | POA: Diagnosis present

## 2018-12-23 DIAGNOSIS — F1410 Cocaine abuse, uncomplicated: Secondary | ICD-10-CM

## 2018-12-23 NOTE — Consult Note (Signed)
Telepsych Consultation   Reason for Consult:  IVC'd due to reported suicidal ideations Referring Physician:  EDP Location of Patient:  Location of Provider: Behavioral Health TTS Department  Patient Identification: Carolyn Roach MRN:  [id number] Principal Diagnosis: Substance induced mood disorder (HCC) Diagnosis:  Principal Problem:   Substance induced mood disorder (HCC)   Total Time spent with patient: 30 minutes  Subjective:   Carolyn Roach is a 33 y.o. female patient brought to ED by [facility] Per officer husband filed IVC papers in which are being brought to ED. Per husband officer husband called stating patient wanted to kill herself. Patient denies any SI or HI. Patient states that here and husband are in the process of getting a divorce and husband is trying to get custody of children and propriety. Patient also reports got into verbal fight with husband because she recently went to GYN last week and was diagnosed with gonorrhea in which she states "given to her by her husband." .  HPI: Patient is seen by me via tele-psych and I have consulted with Dr. Lucianne Muss.  Patient denies any suicidal or homicidal ideations and denies any hallucinations.  Patient does admit to relapsing on cocaine on Monday.  Patient continues to report that she does not have a firearm and she is a convicted felon and is against the law for her to have a firearm.  She states it would ruin everything that she was worked towards her she had a firearm.  She reports that she can go to her father's and stay and is a safe place to stay and to work things out with her husband possibly.  It was reported that patient is going through a divorce and that this may be retaliation from the husband due to the divorce and her trying to get custody of the children.   Collateral from father Marlene Bast [phone] father was contacted by phone and he reports that there is no safety concerns.  He states  they are aware of her substance abuse as 10 years ago she did go through a intense rehabilitation program.  He states that once in a while she does relapse but has not been of serious concern.  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  He states that she is even been very responsible and paying back her bills instead of chasing after drugs or spending all of her Haskell Rihn on drugs.  He also states that yesterday he received a phone call from the patient's mother.  He states that the patient's mother and the patient's husband I collaborated together to give the patient IV seed and sent back to the hospital so that she could be forced into treatment for drug use.  He states there was no mention at the time of her having any suicidal ideations or any mention of a gun.  Patient's father states that she is more than welcome to come and stay with him for a few days so that she can get things straightened out.  Past Psychiatric History: substance abuse history about 10 years ago relapsed 1 week ago x 1 time a week ago  Risk to Self: Suicidal Ideation: No Suicidal Intent: No Is patient at risk for suicide?: No Suicidal Plan?: No(Petitioner alleges she may have a gun.) Access to Means: Yes Specify Access to Suicidal Means: Petitioner reports she may have a gun. What has  been your use of drugs/alcohol within the last 12 months?: Cocaine & THC How many times?: 0 Other Self Harm Risks: None Triggers for Past Attempts: None known Intentional Self Injurious Behavior: None Risk to Others: Homicidal Ideation: No Thoughts of Harm to Others: No Current Homicidal Intent: No Current Homicidal Plan: No Access to Homicidal Means: No Identified Victim: No one History of harm to others?: No Assessment of Violence: None Noted Violent Behavior Description: (Pt denies) Does patient have access to weapons?: No(Husband alleges that she may have a  gun.) Criminal Charges Pending?: No Does patient have a court date: No Prior Inpatient Therapy: Prior Inpatient Therapy: Yes Prior Therapy Dates: 2009 Prior Therapy Facilty/Provider(s): [facility] program Reason for Treatment: SA Prior Outpatient Therapy: Prior Outpatient Therapy: Yes Prior Therapy Dates: 2009  Prior Therapy Facilty/Provider(s): [facility] Reason for Treatment: program related Does patient have an ACCT team?: No Does patient have Intensive In-House Services?  : No Does patient have [facility] services? : No Does patient have P4CC services?: No  Past Medical History:  Past Medical History:  Diagnosis Date  . Cocaine abuse (HCC)    8 years ago, went to rehab  . Depression   . Ruptured ovarian cyst 04/2018  . Vaginal Pap smear, abnormal     Past Surgical History:  Procedure Laterality Date  . CERVICAL CONE BIOPSY    . TONSILLECTOMY AND ADENOIDECTOMY N/A 08/13/2018   Procedure: TONSILLECTOMY;  Surgeon: Newman Pieseoh, Su, MD;  Location: [facility];  Service: ENT;  Laterality: N/A;  . WISDOM TOOTH EXTRACTION     Family History:  Family History  Problem Relation Age of Onset  . Hypertension Mother   . Diabetes Maternal Grandmother    Family Psychiatric  History: None known Social History:  Social History   Substance and Sexual Activity  Alcohol Use Yes   Comment: occassional      Social History   Substance and Sexual Activity  Drug Use Yes  . Types: Marijuana, Cocaine    Social History   Socioeconomic History  . Marital status: Married    Spouse name: Not on file  . Number of children: Not on file  . Years of education: Not on file  . Highest education level: Not on file  Occupational History  . Not on file  Social Needs  . Financial resource strain: Not on file  . Food insecurity:    Worry: Not on file    Inability: Not on file  . Transportation needs:    Medical: Not on file    Non-medical: Not on file  Tobacco Use  . Smoking status:  Current Every Day Smoker    Packs/day: 1.00    Types: Cigarettes  . Smokeless tobacco: Never Used  Substance and Sexual Activity  . Alcohol use: Yes    Comment: occassional   . Drug use: Yes    Types: Marijuana, Cocaine  . Sexual activity: Not on file  Lifestyle  . Physical activity:    Days per week: Not on file    Minutes per session: Not on file  . Stress: Not on file  Relationships  . Social connections:    Talks on phone: Not on file    Gets together: Not on file    Attends religious service: Not on file    Active member of club or organization: Not on file    Attends meetings of clubs or organizations: Not on file    Relationship status: Not on file  Other  Topics Concern  . Not on file  Social History Narrative  . Not on file   Additional Social History:    Allergies:   Allergies  Allergen Reactions  . Other Anaphylaxis and Other (See Comments)    Pt states that she is allergic to curry.      Labs:  Results for orders placed or performed during the hospital encounter of 12/22/18 (from the past 48 hour(s))  Rapid urine drug screen (hospital performed)     Status: Abnormal   Collection Time: 12/22/18  6:35 PM  Result Value Ref Range   Opiates NONE DETECTED NONE DETECTED   Cocaine POSITIVE (A) NONE DETECTED   Benzodiazepines NONE DETECTED NONE DETECTED   Amphetamines NONE DETECTED NONE DETECTED   Tetrahydrocannabinol POSITIVE (A) NONE DETECTED   Barbiturates NONE DETECTED NONE DETECTED    Comment: (NOTE) DRUG SCREEN FOR MEDICAL PURPOSES ONLY.  IF CONFIRMATION IS NEEDED FOR ANY PURPOSE, NOTIFY LAB WITHIN 5 DAYS. LOWEST DETECTABLE LIMITS FOR URINE DRUG SCREEN Drug Class                     Cutoff (ng/mL) Amphetamine and metabolites    1000 Barbiturate and metabolites    200 Benzodiazepine                 200 Tricyclics and metabolites     300 Opiates and metabolites        300 Cocaine and metabolites        300 THC                             50 Performed at [facility], [address], [place], [place] [address]   Comprehensive metabolic panel     Status: None   Collection Time: 12/22/18  7:00 PM  Result Value Ref Range   Sodium 137 135 - 145 mmol/L   Potassium 3.6 3.5 - 5.1 mmol/L   Chloride 105 98 - 111 mmol/L   CO2 23 22 - 32 mmol/L   Glucose, Bld 95 70 - 99 mg/dL   BUN 14 6 - 20 mg/dL   Creatinine, Ser 3.61 0.44 - 1.00 mg/dL   Calcium 9.1 8.9 - 44.3 mg/dL   Total Protein 7.1 6.5 - 8.1 g/dL   Albumin 3.9 3.5 - 5.0 g/dL   AST 19 15 - 41 U/L   ALT 21 0 - 44 U/L   Alkaline Phosphatase 60 38 - 126 U/L   Total Bilirubin 0.4 0.3 - 1.2 mg/dL   GFR calc non Af Amer >60 >60 mL/min   GFR calc Af Amer >60 >60 mL/min   Anion gap 9 5 - 15    Comment: Performed at [facility], [address] [place], [place] [address]  Ethanol     Status: None   Collection Time: 12/22/18  7:00 PM  Result Value Ref Range   Alcohol, Ethyl (B) <10 <10 mg/dL    Comment: (NOTE) Lowest detectable limit for serum alcohol is 10 mg/dL. For medical purposes only. Performed at [facility], [address] [place], [place] [address]   CBC with Diff     Status: Abnormal   Collection Time: 12/22/18  7:00 PM  Result Value Ref Range   WBC 11.5 (H) 4.0 - 10.5 K/uL   RBC 4.28 3.87 - 5.11 MIL/uL   Hemoglobin 13.6 12.0 - 15.0 g/dL   HCT 95.0 93.2 - 67.1 %  MCV 93.9 80.0 - 100.0 fL   MCH 31.8 26.0 - 34.0 pg   MCHC 33.8 30.0 - 36.0 g/dL   RDW 95.3 96.7 - 28.9 %   Platelets 387 150 - 400 K/uL   nRBC 0.0 0.0 - 0.2 %   Neutrophils Relative % 62 %   Neutro Abs 7.1 1.7 - 7.7 K/uL   Lymphocytes Relative 29 %   Lymphs Abs 3.3 0.7 - 4.0 K/uL   Monocytes Relative 7 %   Monocytes Absolute 0.8 0.1 - 1.0 K/uL   Eosinophils Relative 1 %   Eosinophils Absolute 0.2 0.0 - 0.5 K/uL   Basophils Relative 0 %   Basophils Absolute 0.1 0.0 - 0.1 K/uL   Immature Granulocytes 1 %   Abs Immature Granulocytes 0.07 0.00 - 0.07 K/uL    Comment: Performed at [facility], [address] [place], [place] [address]    Medications:  Current Facility-Administered Medications  Medication Dose Route Frequency Provider Last Rate Last Dose  . acetaminophen (TYLENOL) tablet 650 mg  650 mg Oral Q4H PRN Cheron Schaumann K, PA-C      . alum & mag hydroxide-simeth (MAALOX/MYLANTA) 200-200-20 MG/5ML suspension 30 mL  30 mL Oral Q6H PRN Cheron Schaumann K, PA-C      . nicotine (NICODERM CQ - dosed in mg/24 hours) patch 14 mg  14 mg Transdermal Once Sofia, Leslie K, PA-C   14 mg at 12/22/18 2244  . ondansetron (ZOFRAN) tablet 4 mg  4 mg Oral Q8H PRN Sofia, Leslie K, PA-C      . zolpidem (AMBIEN) tablet 5 mg  5 mg Oral QHS PRN Sofia, Leslie K, PA-C   5 mg at 12/22/18 2244   Current Outpatient Medications  Medication Sig Dispense Refill  . acetaminophen (TYLENOL) 325 MG tablet Take 650 mg by mouth every 6 (six) hours as needed for mild pain or headache.     . doxycycline (MONODOX) 100 MG capsule Take 1 capsule by mouth 2 (two) times daily.    . metroNIDAZOLE (FLAGYL) 500 MG tablet Take 1 tablet by mouth 2 (two) times daily.    Marland Kitchen ibuprofen (ADVIL,MOTRIN) 800 MG tablet Take 1 tablet (800 mg total) by mouth every 6 (six) hours. (Patient not taking: Reported on 12/22/2018) 30 tablet 0  . oxyCODONE (ROXICODONE) 5 MG/5ML solution Take 5-10 mLs (5-10 mg total) by mouth every 4 (four) hours as needed for severe pain. (Patient not taking: Reported on 12/22/2018) 300 mL 0    Musculoskeletal: Strength & Muscle Tone: within normal limits Gait & Station: normal Patient leans: N/A  Psychiatric Specialty Exam: Physical Exam  Nursing note and vitals reviewed. Constitutional: She is oriented to person, place, and time. She appears well-developed and well-nourished.  Cardiovascular: Normal rate.  Respiratory: Effort normal.  Musculoskeletal: Normal range of motion.  Neurological: She is alert and oriented to person, place, and time.  Skin: Skin is warm.    Review of Systems   Constitutional: Negative.   HENT: Negative.   Eyes: Negative.   Respiratory: Negative.   Cardiovascular: Negative.   Gastrointestinal: Negative.   Genitourinary: Negative.   Musculoskeletal: Negative.   Skin: Negative.   Neurological: Negative.   Endo/Heme/Allergies: Negative.   Psychiatric/Behavioral: Positive for substance abuse.    Blood pressure 118/86, pulse 98, temperature 98.8 F (37.1 C), temperature source Oral, resp. rate 20, height 5\' 7"  (1.702 m), weight 65.8 kg, last menstrual period 12/01/2018, SpO2 100 %, unknown if currently breastfeeding.Body mass index is 22.71  kg/m.  General Appearance: Casual  Eye Contact:  Good  Speech:  Clear and Coherent and Normal Rate  Volume:  Normal  Mood:  Euthymic  Affect:  Congruent  Thought Process:  Coherent and Descriptions of Associations: Intact  Orientation:  Full (Time, Place, and Person)  Thought Content:  WDL  Suicidal Thoughts:  No  Homicidal Thoughts:  No  Memory:  Immediate;   Good Recent;   Good Remote;   Good  Judgement:  Fair  Insight:  Fair  Psychomotor Activity:  Normal  Concentration:  Concentration: Good and Attention Span: Good  Recall:  Good  Fund of Knowledge:  Good  Language:  Good  Akathisia:  No  Handed:  Right  AIMS (if indicated):     Assets:  Communication Skills Desire for Improvement Housing Physical Health Social Support Transportation  ADL's:  Intact  Cognition:  WNL  Sleep:        Treatment Plan Summary: Follow up with outpatient resources, NA, therapy, substance abuse treatment Discharge home with father   Disposition: No evidence of imminent risk to self or others at present.   Patient does not meet criteria for psychiatric inpatient admission. Supportive therapy provided about ongoing stressors. Discussed crisis plan, support from social network, calling 911, coming to the Emergency Department, and calling Suicide Hotline. I have contacted Dr. Effie Shy and notified him of the  recommendations that the patient does not meet inpatient criteria and is psychiatrically cleared.  This service was provided via telemedicine using a 2-way, interactive audio and video technology.  Names of all persons participating in this telemedicine service and their role in this encounter. Name: Dahlia Client Dymond Role: patient  Name: Feliz Beam Manish Ruggiero NP Role: Provider  Name: Marlene Bast Role: Father (phone call)  Name:  Role:     Maryfrances Bunnell, FNP 12/23/2018 8:36 AM

## 2018-12-23 NOTE — ED Notes (Signed)
TTS in progress at this time.  

## 2018-12-23 NOTE — ED Provider Notes (Signed)
The case was discussed with TTS, practitioner.  He has attempted to contact the husband however he could not be reached.  Mr. Liliane Shi was able to contact the patient's father who feels that the patient is not at risk for suicide.  He does not think that she is unstable, and is offering to let her come to his home, as the patient works through her domestic difficulty.  At this time the patient is alert, and lucid, and states that she is not suicidal and has no intent to continue substance abuse.  She states that she did not buy a gun or state she wanted to kill herself.  Patient was somewhat vague about whether or not she wanted to have ongoing contact with her children.  She stated that she has "talked to a lawyer about divorce," but there are no papers filed yet.  She states that she is not trying to limit her husband's custody of their children.  She thinks that she can talk to her husband, Kipp Brood, about the situation and come to an agreeable plan, going forward.  The patient's IVC paperwork is rescinded by me at this time.  She will call her father to come and get her.   Mancel Bale, MD 12/23/18 [phone]

## 2018-12-23 NOTE — Discharge Instructions (Addendum)
Avoid using cocaine.  Follow-up at one of the local counseling centers, for help with substance abuse, and marital difficulties.  See the attached list.  Return here, if needed, for problems.

## 2018-12-23 NOTE — ED Notes (Signed)
IVC paperwork resended and faxed to magistrates office at this time.

## 2018-12-23 NOTE — ED Notes (Signed)
Received report on pt, pt lying on right side, resp even and nonlabored, no distress noted, sitter remains at bedside,

## 2019-01-31 DIAGNOSIS — R490 Dysphonia: Secondary | ICD-10-CM | POA: Diagnosis not present

## 2019-01-31 DIAGNOSIS — J0101 Acute recurrent maxillary sinusitis: Secondary | ICD-10-CM | POA: Diagnosis not present

## 2019-04-10 DIAGNOSIS — N939 Abnormal uterine and vaginal bleeding, unspecified: Secondary | ICD-10-CM | POA: Diagnosis not present

## 2019-04-10 DIAGNOSIS — Z6823 Body mass index (BMI) 23.0-23.9, adult: Secondary | ICD-10-CM | POA: Diagnosis not present

## 2019-04-10 DIAGNOSIS — M25531 Pain in right wrist: Secondary | ICD-10-CM | POA: Diagnosis not present

## 2019-04-10 DIAGNOSIS — S63501A Unspecified sprain of right wrist, initial encounter: Secondary | ICD-10-CM | POA: Diagnosis not present

## 2019-04-15 DIAGNOSIS — N939 Abnormal uterine and vaginal bleeding, unspecified: Secondary | ICD-10-CM | POA: Diagnosis not present

## 2019-04-15 DIAGNOSIS — Z113 Encounter for screening for infections with a predominantly sexual mode of transmission: Secondary | ICD-10-CM | POA: Diagnosis not present

## 2019-04-19 DIAGNOSIS — O021 Missed abortion: Secondary | ICD-10-CM | POA: Diagnosis not present

## 2019-04-19 DIAGNOSIS — D649 Anemia, unspecified: Secondary | ICD-10-CM | POA: Diagnosis not present

## 2019-04-19 DIAGNOSIS — O030 Genital tract and pelvic infection following incomplete spontaneous abortion: Secondary | ICD-10-CM | POA: Diagnosis not present

## 2019-04-19 DIAGNOSIS — F17210 Nicotine dependence, cigarettes, uncomplicated: Secondary | ICD-10-CM | POA: Diagnosis not present

## 2019-04-19 DIAGNOSIS — O074 Failed attempted termination of pregnancy without complication: Secondary | ICD-10-CM | POA: Diagnosis not present

## 2019-04-19 DIAGNOSIS — O031 Delayed or excessive hemorrhage following incomplete spontaneous abortion: Secondary | ICD-10-CM | POA: Diagnosis not present

## 2019-04-19 DIAGNOSIS — N938 Other specified abnormal uterine and vaginal bleeding: Secondary | ICD-10-CM | POA: Diagnosis not present

## 2019-05-15 DIAGNOSIS — Z302 Encounter for sterilization: Secondary | ICD-10-CM | POA: Diagnosis not present

## 2019-05-15 DIAGNOSIS — F17210 Nicotine dependence, cigarettes, uncomplicated: Secondary | ICD-10-CM | POA: Diagnosis not present

## 2019-05-15 DIAGNOSIS — Z1159 Encounter for screening for other viral diseases: Secondary | ICD-10-CM | POA: Diagnosis not present

## 2019-05-17 DIAGNOSIS — Z302 Encounter for sterilization: Secondary | ICD-10-CM | POA: Diagnosis not present

## 2019-05-17 DIAGNOSIS — F17210 Nicotine dependence, cigarettes, uncomplicated: Secondary | ICD-10-CM | POA: Diagnosis not present

## 2019-07-18 IMAGING — US US PELVIS COMPLETE
1 series · 13 of 25 positions shown · non-contrast
Comparison: CT abdomen pelvis from same day

CLINICAL DATA: Left lower quadrant pain since [REDACTED].

EXAM:
TRANSABDOMINAL AND TRANSVAGINAL ULTRASOUND OF PELVIS
DOPPLER ULTRASOUND OF OVARIES
TECHNIQUE: Both transabdominal and transvaginal ultrasound examinations of the
pelvis were performed. Transabdominal technique was performed for
global imaging of the pelvis including uterus, ovaries, adnexal
regions, and pelvic cul-de-sac.
It was necessary to proceed with endovaginal exam following the
transabdominal exam to visualize the endometrium and ovaries. Color
and duplex Doppler ultrasound was utilized to evaluate blood flow to
the ovaries.

[Series 1: us pelvis complete · 0.22mm/px · 13 of 108 slices shown]
[im 1/108]
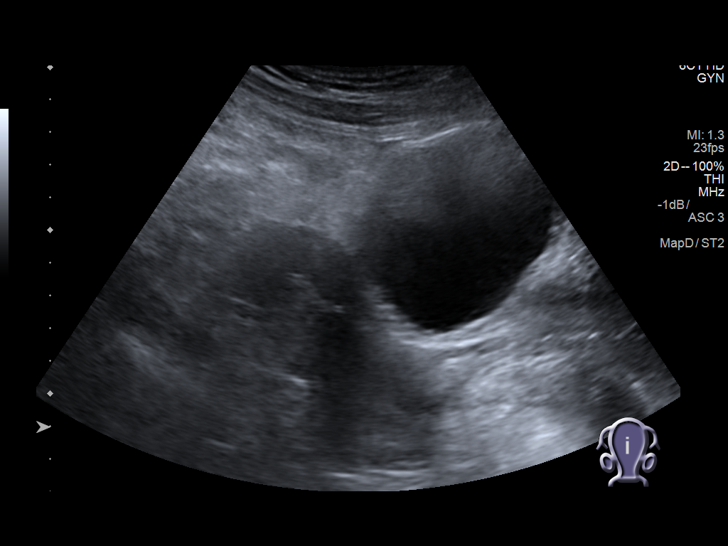
[im 9/108]
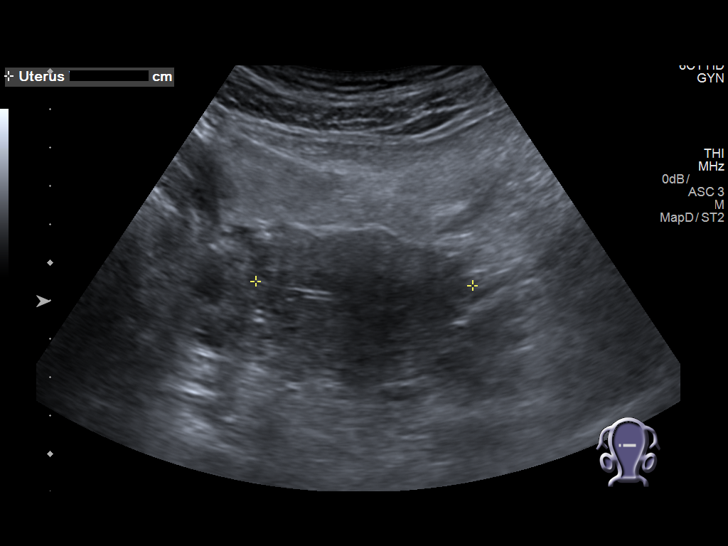
[im 18/108]
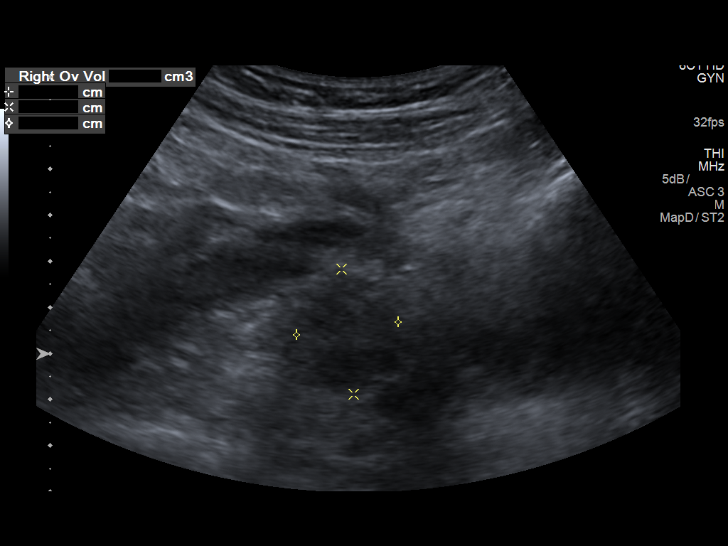
[im 27/108]
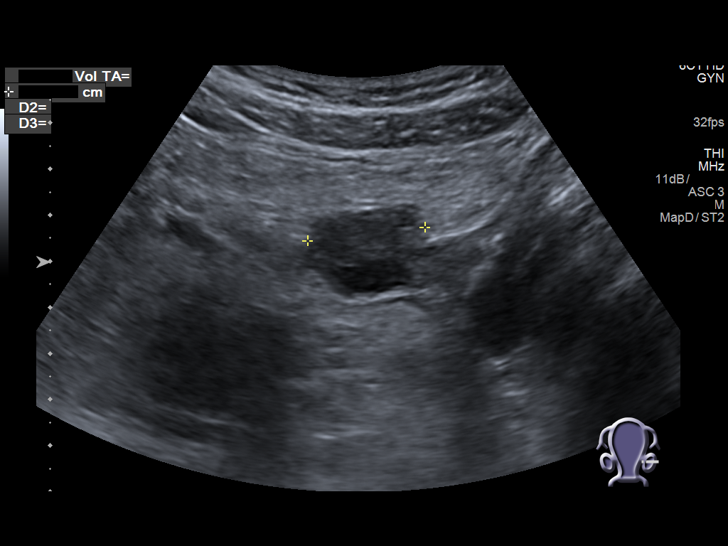
[im 36/108]
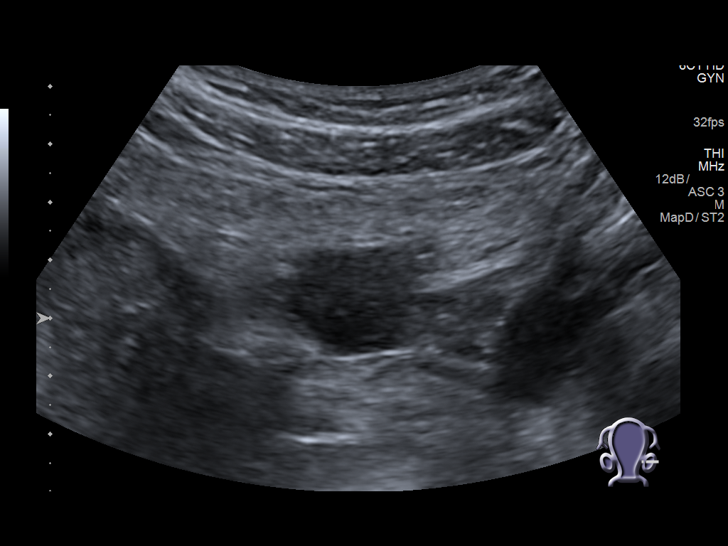
[im 45/108]
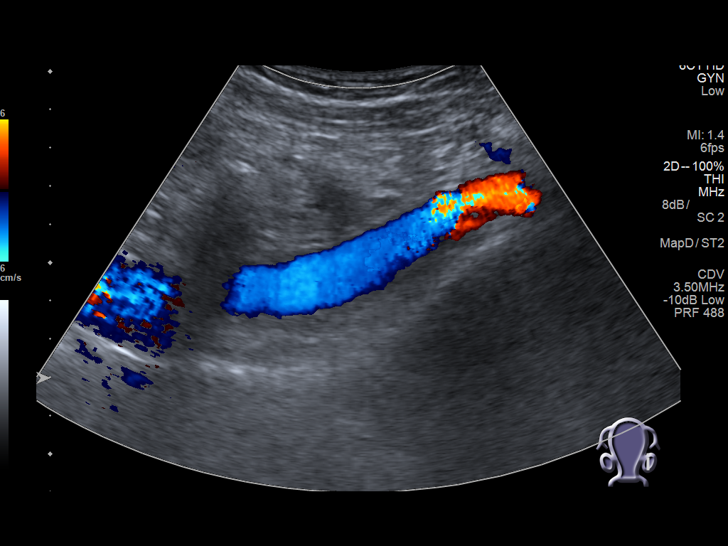
[im 54/108]
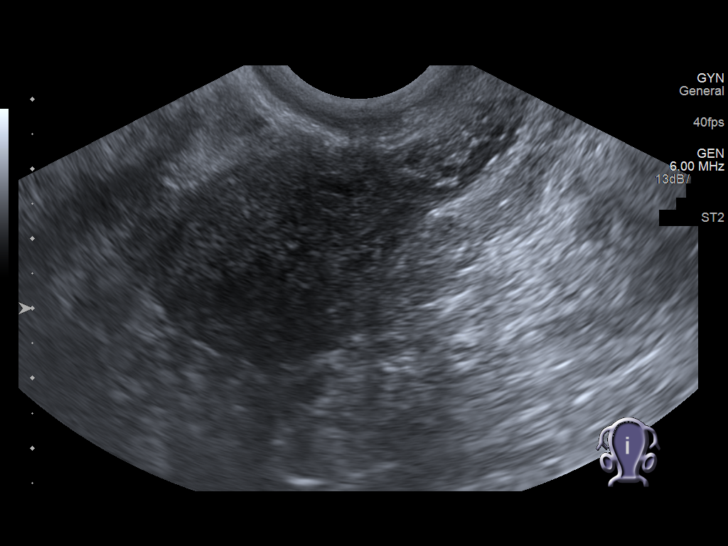
[im 63/108]
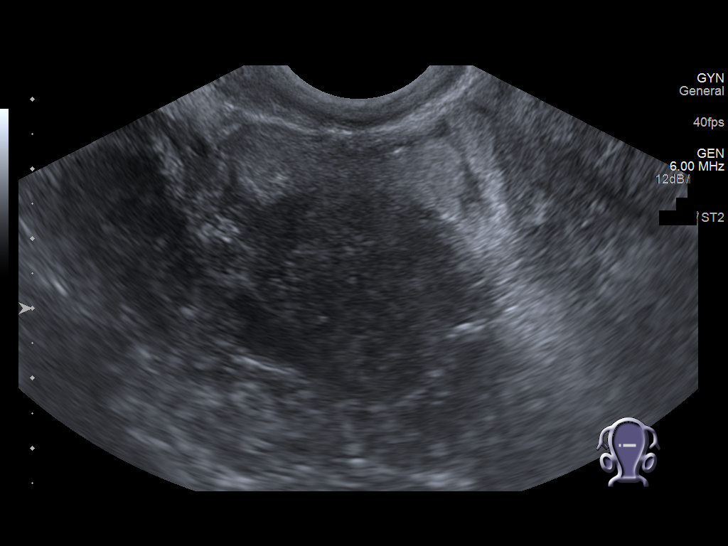
[im 72/108]
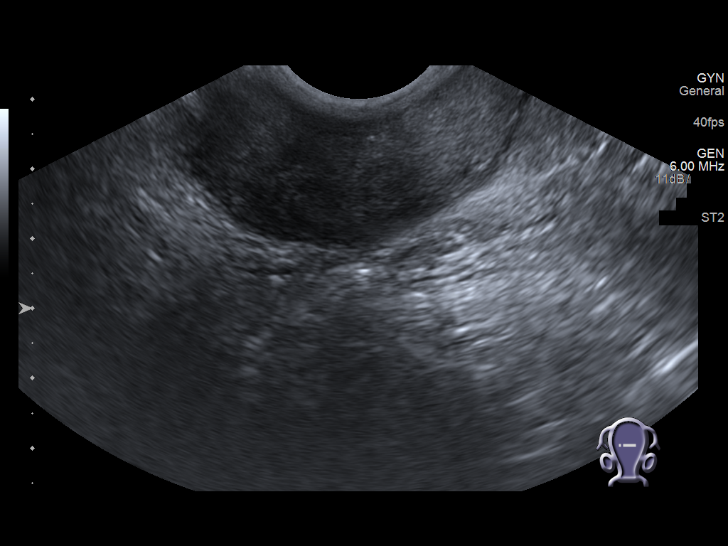
[im 81/108]
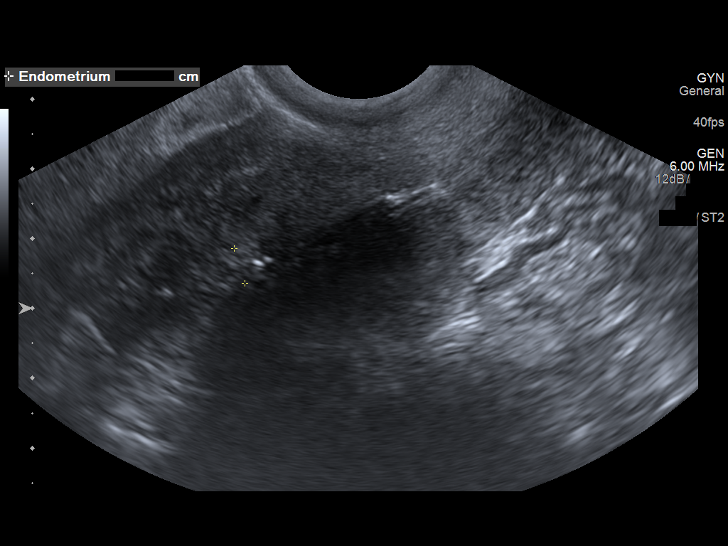
[im 90/108]
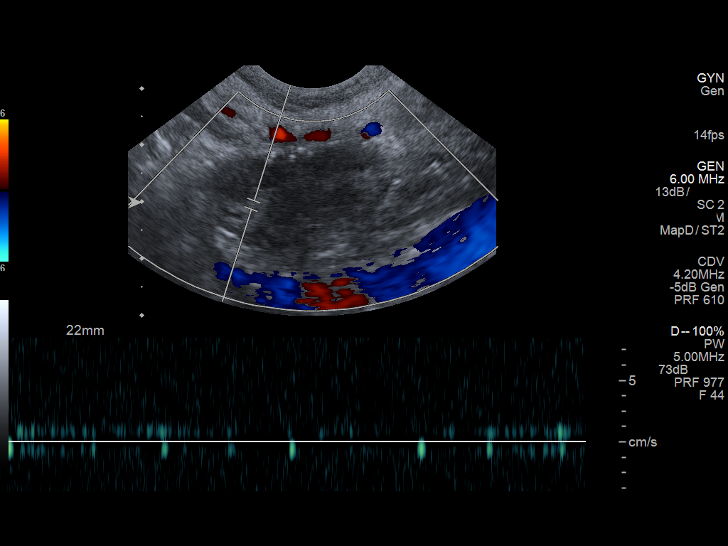
[im 99/108]
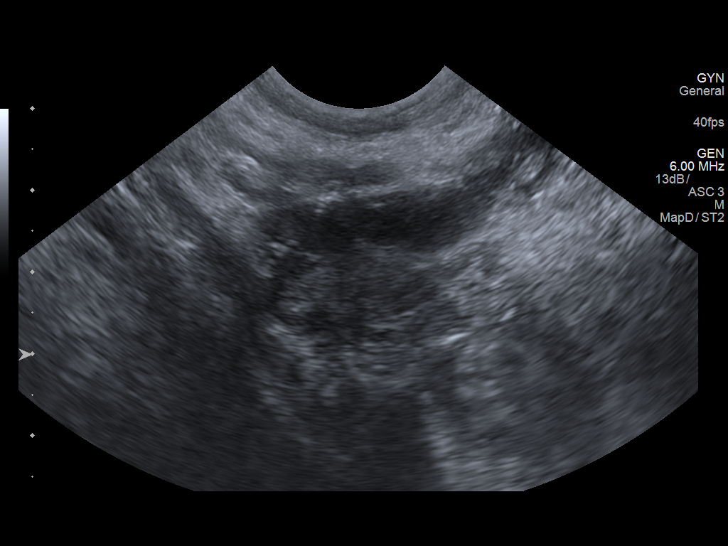
[im 108/108]
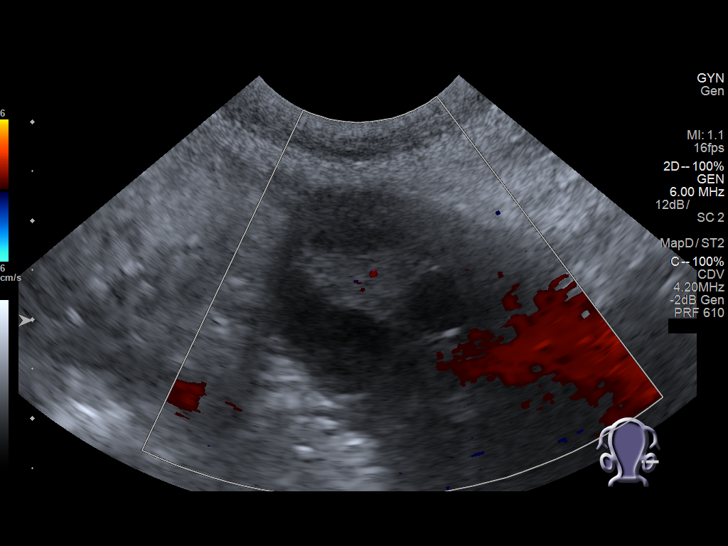

[13 of 25 positions shown; findings below may reference images not displayed]

FINDINGS: Uterus

Measurements: 7.8 x 3.9 x 5.0 cm. No fibroids or other mass
visualized.

Endometrium

Thickness: 5 mm. No focal abnormality visualized. IUD appropriately
positioned within the endometrial canal.

Right ovary

Measurements: 2.9 x 1.8 x 2.6 cm. Normal appearance/no adnexal mass.

Left ovary

Measurements: 3.6 x 1.8 x 2.6 cm. Normal appearance/no adnexal mass.

Pulsed Doppler evaluation of both ovaries demonstrates normal
low-resistance arterial and venous waveforms.

Other findings

No abnormal free fluid.
IMPRESSION: 1. Normal pelvic ultrasound. No sonographic evidence of ovarian
torsion.
2. Appropriately positioned IUD within the endometrial canal.

## 2019-09-02 DIAGNOSIS — Z6828 Body mass index (BMI) 28.0-28.9, adult: Secondary | ICD-10-CM | POA: Diagnosis not present

## 2019-09-02 DIAGNOSIS — N760 Acute vaginitis: Secondary | ICD-10-CM | POA: Diagnosis not present

## 2019-10-01 DIAGNOSIS — Z20828 Contact with and (suspected) exposure to other viral communicable diseases: Secondary | ICD-10-CM | POA: Diagnosis not present

## 2019-10-01 DIAGNOSIS — F17200 Nicotine dependence, unspecified, uncomplicated: Secondary | ICD-10-CM | POA: Diagnosis not present

## 2019-10-01 DIAGNOSIS — J020 Streptococcal pharyngitis: Secondary | ICD-10-CM | POA: Diagnosis not present

## 2019-10-01 DIAGNOSIS — J069 Acute upper respiratory infection, unspecified: Secondary | ICD-10-CM | POA: Diagnosis not present

## 2019-10-01 DIAGNOSIS — J029 Acute pharyngitis, unspecified: Secondary | ICD-10-CM | POA: Diagnosis not present

## 2019-12-09 IMAGING — CT CT ABD-PELV W/ CM
2 of 4 series · 16 of 46 positions shown, 18 images · IV contrast (Isovue)
Comparison: None.

CLINICAL DATA: Upper abdominal pain radiating to the left.

EXAM:
CT ABDOMEN AND PELVIS WITH CONTRAST
TECHNIQUE: Multidetector CT imaging of the abdomen and pelvis was performed
using the standard protocol following bolus administration of
intravenous contrast.
CONTRAST:  100mL [id number] IOPAMIDOL ([id number]) INJECTION 61%

[Series 2: axial st · axial · 0.73mm/px · z∈[-684,-270]mm · 13 of 91 slices shown, 15 images]
[im 4/91  soft-tissue]
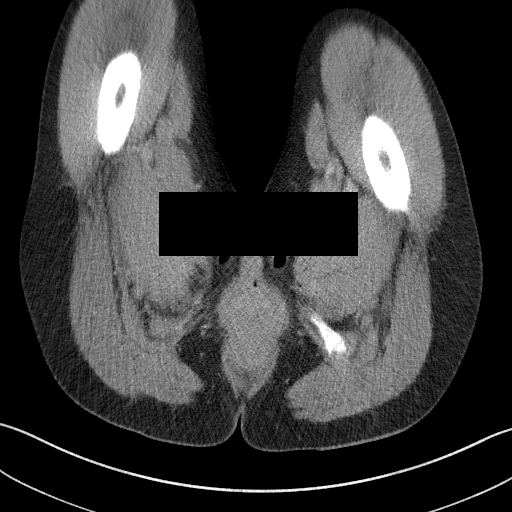
[im 4/91  bone]
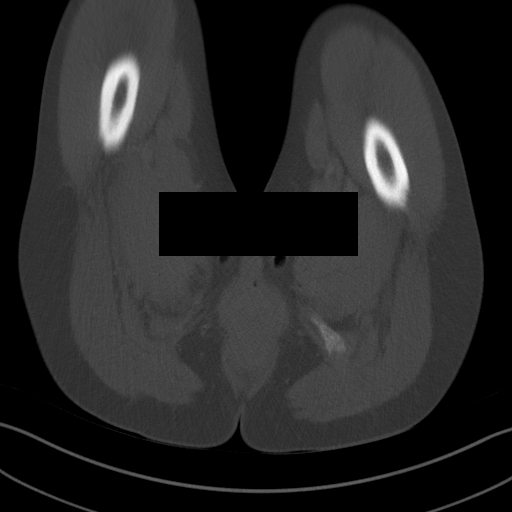
[im 12/91  soft-tissue]
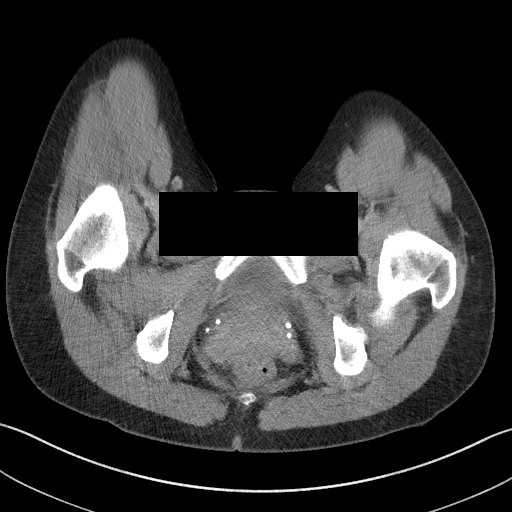
[im 20/91  soft-tissue]
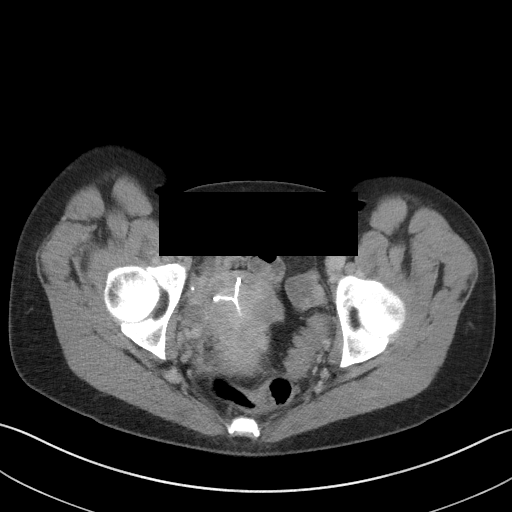
[im 24/91  soft-tissue]
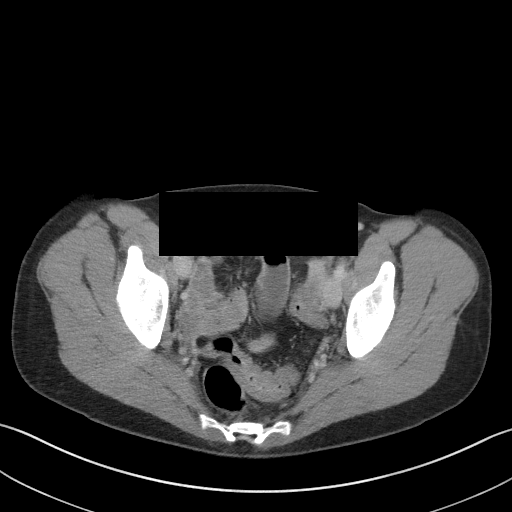
[im 32/91  soft-tissue]
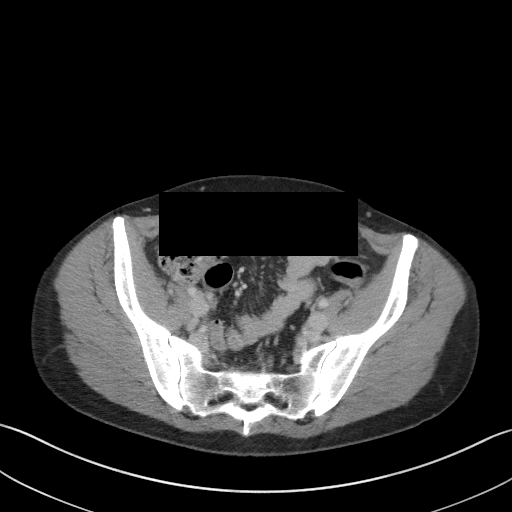
[im 40/91  soft-tissue]
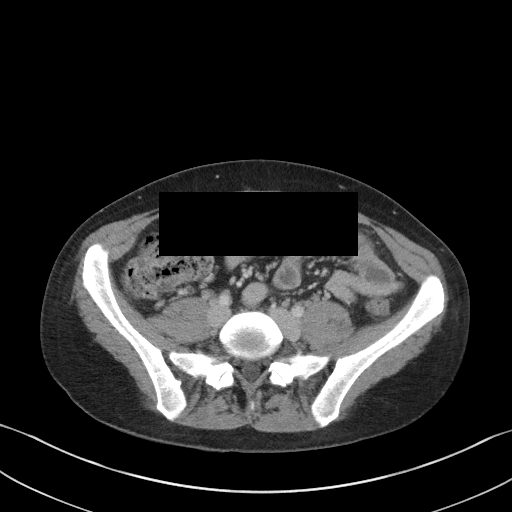
[im 47/91  soft-tissue]
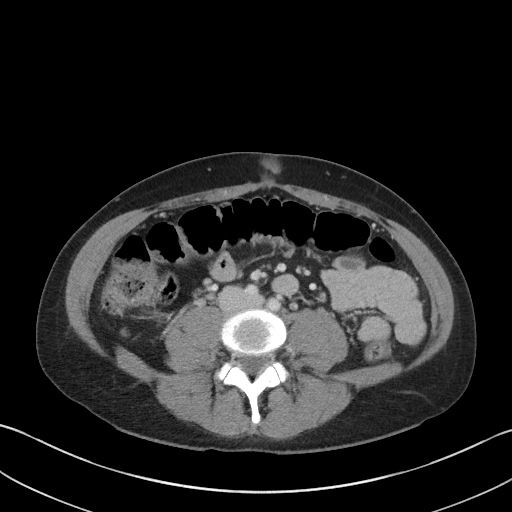
[im 51/91  soft-tissue]
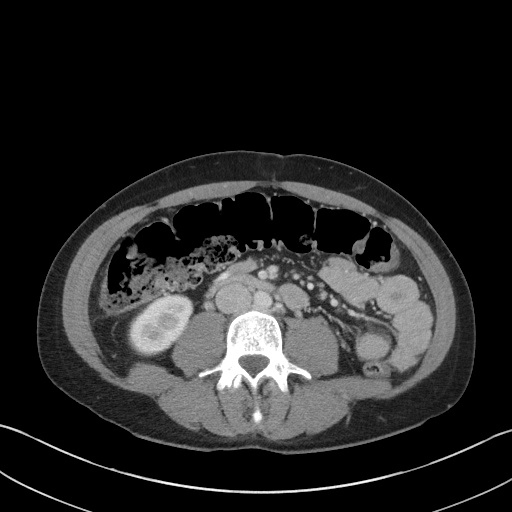
[im 59/91  soft-tissue]
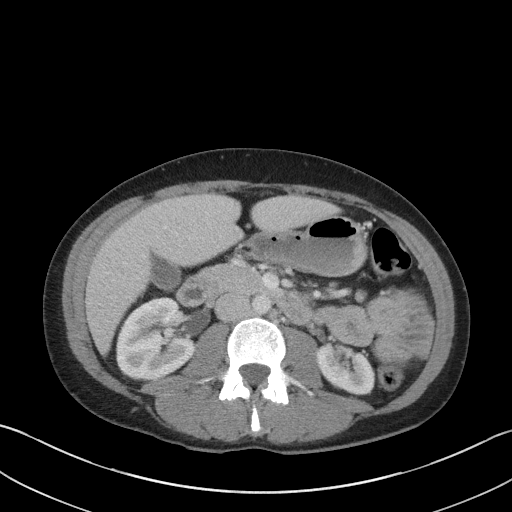
[im 59/91  bone]
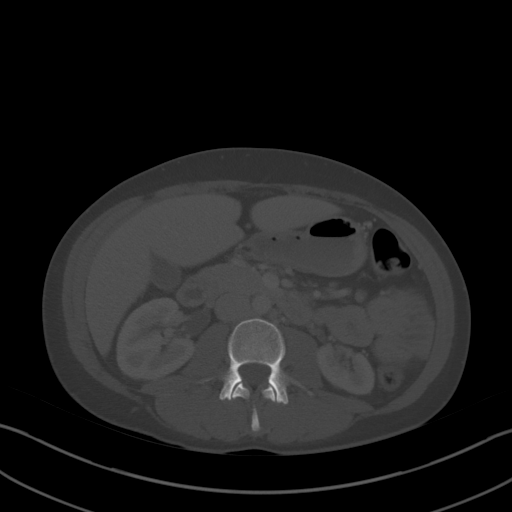
[im 67/91  soft-tissue]
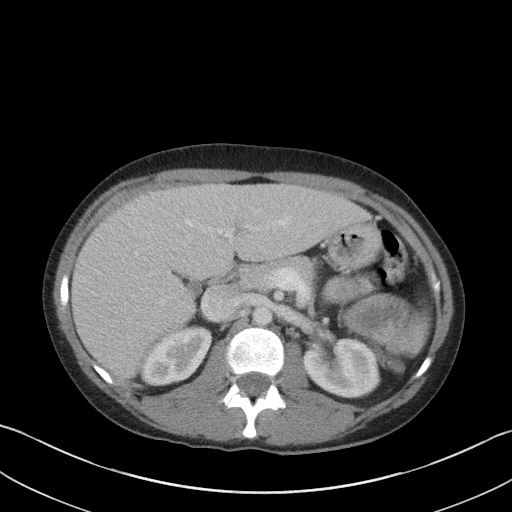
[im 71/91  soft-tissue]
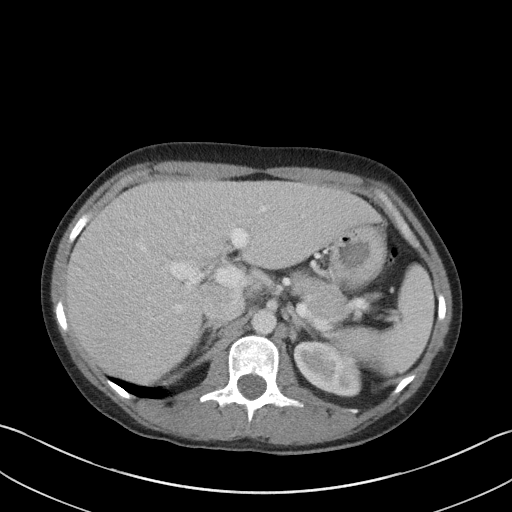
[im 79/91  soft-tissue]
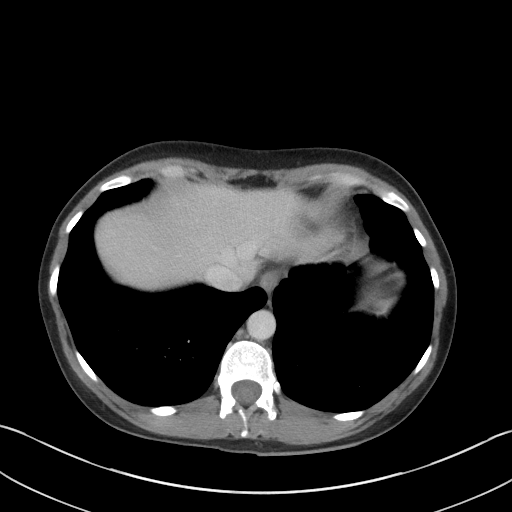
[im 87/91  soft-tissue]
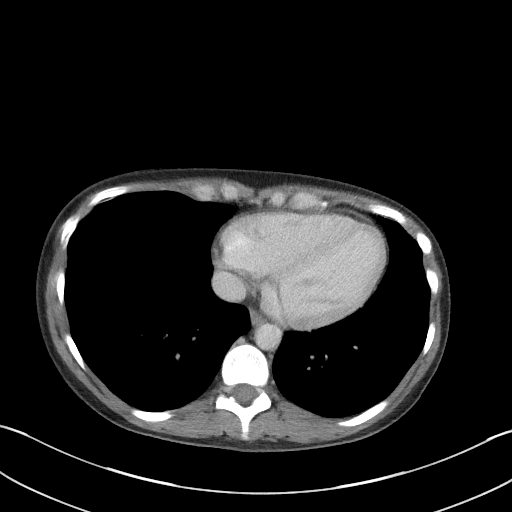

[Series 5: coronal st · coronal · 0.79mm/px · 3 of 76 slices shown]
[im 26/76  soft-tissue]
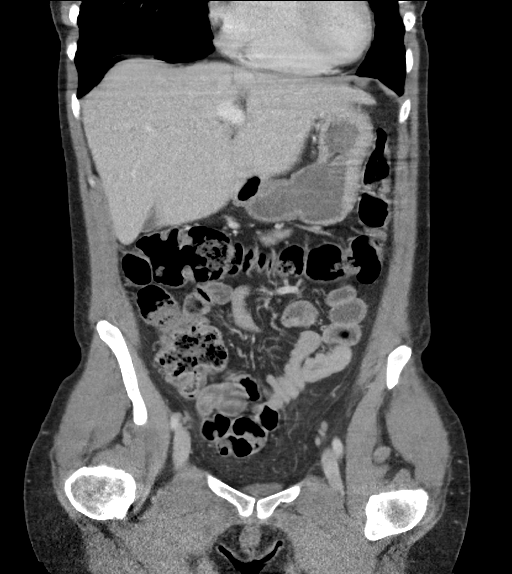
[im 34/76  soft-tissue]
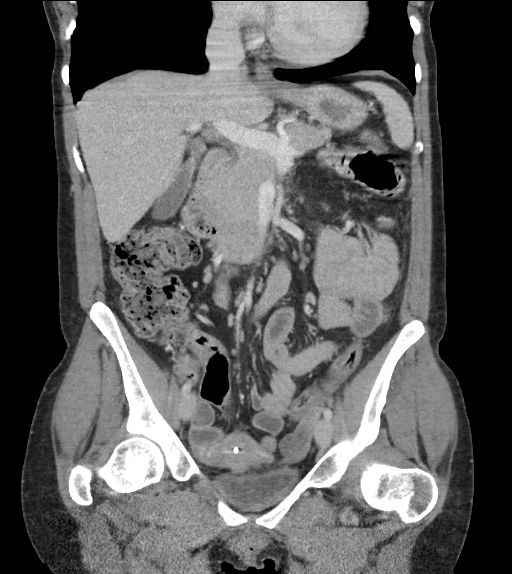
[im 42/76  soft-tissue]
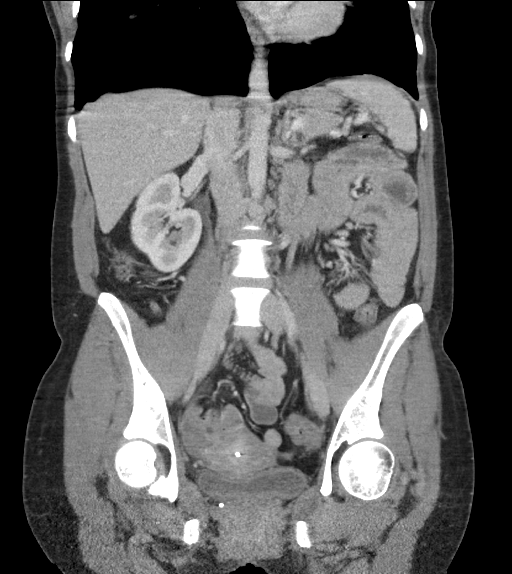

[16 of 46 positions shown; findings below may reference images not displayed]

FINDINGS: Lower chest: No acute abnormality.

Hepatobiliary: Unremarkable

Pancreas: Unremarkable

Spleen: Unremarkable

Adrenals/Urinary Tract: Adrenal glands and right kidney are
unremarkable. There is mild malrotation of the left kidney. Bladder
is decompressed.

Stomach/Bowel: Normal retrocecal appendix. Colon is mildly
distended. There is no convincing bowel wall thickening to suggest
acute diverticulitis. Artificial prominence of the sigmoid colon
wall is likely related to decompression. Wall irregularity of the
distal sigmoid colon on image 71 is likely related to decompression.
There is no disproportionate dilatation of small bowel to suggest
obstruction. Stomach and duodenum are unremarkable.

Vascular/Lymphatic: There is no abnormal retroperitoneal adenopathy.

Reproductive: An IUD is in place within the uterus. There are cystic
changes in the ovaries bilaterally which is appropriate to age and
premenopausal status.

Other: There is no free fluid. There is no free intraperitoneal gas.
Small umbilical hernia contains adipose tissue.

Musculoskeletal: No vertebral compression deformity.
IMPRESSION: No acute intra-abdominal process

Postoperative and chronic changes are noted.

## 2020-05-22 DIAGNOSIS — L309 Dermatitis, unspecified: Secondary | ICD-10-CM | POA: Diagnosis not present

## 2020-05-22 DIAGNOSIS — Z6832 Body mass index (BMI) 32.0-32.9, adult: Secondary | ICD-10-CM | POA: Diagnosis not present

## 2020-05-22 DIAGNOSIS — Z01419 Encounter for gynecological examination (general) (routine) without abnormal findings: Secondary | ICD-10-CM | POA: Diagnosis not present

## 2020-05-22 DIAGNOSIS — Z113 Encounter for screening for infections with a predominantly sexual mode of transmission: Secondary | ICD-10-CM | POA: Diagnosis not present

## 2020-09-01 DIAGNOSIS — Z23 Encounter for immunization: Secondary | ICD-10-CM | POA: Diagnosis not present

## 2020-09-01 DIAGNOSIS — Z6833 Body mass index (BMI) 33.0-33.9, adult: Secondary | ICD-10-CM | POA: Diagnosis not present

## 2020-09-01 DIAGNOSIS — J309 Allergic rhinitis, unspecified: Secondary | ICD-10-CM | POA: Diagnosis not present

## 2020-09-01 DIAGNOSIS — N760 Acute vaginitis: Secondary | ICD-10-CM | POA: Diagnosis not present

## 2021-02-26 ENCOUNTER — Encounter: Payer: BLUE CROSS/BLUE SHIELD | Admitting: Adult Health

## 2022-06-16 ENCOUNTER — Ambulatory Visit
Admission: EM | Admit: 2022-06-16 | Discharge: 2022-06-16 | Disposition: A | Discharge status: Home / Self Care | Payer: Self-pay | Attending: Nurse Practitioner | Admitting: Nurse Practitioner

## 2022-06-16 DIAGNOSIS — L02414 Cutaneous abscess of left upper limb: Secondary | ICD-10-CM

## 2022-06-16 MED ORDER — DOXYCYCLINE HYCLATE 100 MG PO TABS: 100.0000 mg | ORAL_TABLET | Freq: Two times a day (BID) | ORAL | 0 refills | Status: AC

## 2022-06-16 NOTE — ED Triage Notes (Signed)
States she is an IV drug user.  States she was using meth.  Has an abscess on left forearm that appeared   Was placed on and antibiotic last Friday.  (Bactrim and keflex)  patient states area has gotten better, but still very tender and hard to the touch.   If Rx is needed, it needs to be printed out.

## 2022-06-16 NOTE — ED Provider Notes (Signed)
[facility]    CSN: [id number] Arrival date & time: 06/16/22  0935      History   Chief Complaint No chief complaint on file.   HPI Carolyn Roach is a 36 y.o. female.   The history is provided by the patient and the police.   Patient brought in accompanied by detention officer for complaints of an abscess to the left forearm.  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  Carolyn Roach states that normally when Carolyn Roach injects the drug into her veins it burns, but this time it did not.  Carolyn Roach states immediatel Carolyn Roach developed swelling and pain to the left forearm.  Patient states subsequently thereafter, Carolyn Roach was taken into custody, where the area became enlarged, swollen, and very tender to touch, especially if Carolyn Roach bumped it against something.  States Carolyn Roach was started on Bactrim and Keflex for 4 to 5 days.  Carolyn Roach presents today with an induration to the left forearm.  Patient states area looks "100 times better".  Carolyn Roach continues to have tenderness to the site.  Patient states that Carolyn Roach has been going through withdrawal symptoms so Carolyn Roach feels like most of her symptoms are related to that, but denies any obvious fever, chills, nausea, vomiting, diarrhea, or chest pain at present.  The detention officer accompanying patient states that the nurse at the facility was "hoping we could drain it".  Past Medical History:  Diagnosis Date   Cocaine abuse (HCC)    8 years ago, went to rehab   Depression    Ruptured ovarian cyst 04/2018   Vaginal Pap smear, abnormal     Patient Active Problem List   Diagnosis Date Noted   Substance induced mood disorder (HCC) 12/23/2018   Postpartum care following vaginal delivery (9/13) 06/08/2016    Past Surgical History:  Procedure Laterality Date   CERVICAL CONE BIOPSY     TONSILLECTOMY AND ADENOIDECTOMY N/A 08/13/2018   Procedure: TONSILLECTOMY;  Surgeon: Newman Pies, MD;  Location: [facility];  Service: ENT;   Laterality: N/A;   WISDOM TOOTH EXTRACTION      OB History     Gravida  4   Para  2   Term  2   Preterm      AB  2   Living  2      SAB  1   IAB  1   Ectopic      Multiple  0   Live Births  2            Home Medications    Prior to Admission medications   Medication Sig Start Date End Date Taking? Authorizing Provider  doxycycline (VIBRA-TABS) 100 MG tablet Take 1 tablet (100 mg total) by mouth 2 (two) times daily for 7 days. 06/16/22 06/23/22 Yes Emelly Wurtz-Warren, Sadie Haber, NP  acetaminophen (TYLENOL) 325 MG tablet Take 650 mg by mouth every 6 (six) hours as needed for mild pain or headache.     [provider]  doxycycline (MONODOX) 100 MG capsule Take 1 capsule by mouth 2 (two) times daily. 12/20/18   [provider]  ibuprofen (ADVIL,MOTRIN) 800 MG tablet Take 1 tablet (800 mg total) by mouth every 6 (six) hours. Patient not taking: Reported on 12/22/2018 06/09/16   Raelyn Mora, CNM  metroNIDAZOLE (FLAGYL) 500 MG tablet Take 1 tablet by mouth 2 (two) times daily. 12/20/18   [provider]  oxyCODONE (ROXICODONE) 5 MG/5ML solution Take 5-10  mLs (5-10 mg total) by mouth every 4 (four) hours as needed for severe pain. Patient not taking: Reported on 12/22/2018 08/13/18   Newman Pies, MD    Family History Family History  Problem Relation Age of Onset   Hypertension Mother    Diabetes Maternal Grandmother     Social History Social History   Tobacco Use   Smoking status: Every Day    Packs/day: 1.00    Types: Cigarettes   Smokeless tobacco: Never  Vaping Use   Vaping Use: Never used  Substance Use Topics   Alcohol use: Yes    Comment: occassional    Drug use: Yes    Types: Marijuana, Cocaine, Methamphetamines     Allergies   Other   Review of Systems Review of Systems Per HPI  Physical Exam Triage Vital Signs ED Triage Vitals  Enc Vitals Group     BP 06/16/22 0947 120/82     Pulse Rate 06/16/22 0947 77     Resp  06/16/22 0947 16     Temp 06/16/22 0947 98.2 F (36.8 C)     Temp Source 06/16/22 0947 Oral     SpO2 06/16/22 0947 98 %     Weight --      Height --      Head Circumference --      Peak Flow --      Pain Score 06/16/22 0949 2     Pain Loc --      Pain Edu? --      Excl. in GC? --    No data found.  Updated Vital Signs BP 120/82 (BP Location: Right Arm)   Pulse 77   Temp 98.2 F (36.8 C) (Oral)   Resp 16   LMP 06/16/2022 (Exact Date)   SpO2 98%   Visual Acuity Right Eye Distance:   Left Eye Distance:   Bilateral Distance:    Right Eye Near:   Left Eye Near:    Bilateral Near:     Physical Exam Vitals and nursing note reviewed.  HENT:     Head: Normocephalic.  Eyes:     Extraocular Movements: Extraocular movements intact.     Conjunctiva/sclera: Conjunctivae normal.     Pupils: Pupils are equal, round, and reactive to light.  Cardiovascular:     Rate and Rhythm: Normal rate and regular rhythm.     Pulses: Normal pulses.     Heart sounds: Normal heart sounds.  Pulmonary:     Effort: Pulmonary effort is normal.     Breath sounds: Normal breath sounds.  Abdominal:     General: Bowel sounds are normal.     Palpations: Abdomen is soft.  Musculoskeletal:     Cervical back: Normal range of motion.  Lymphadenopathy:     Cervical: No cervical adenopathy.  Skin:    General: Skin is warm and dry.     Findings: Abscess present.     Comments: Abscess noted to the left forearm.  Area measures approximately 3 cm in diameter.  Area is erythematous and raised.  There is no fluctuance, oozing, or drainage present.  Area is tender to palpation.  Neurological:     General: No focal deficit present.     Mental Status: Carolyn Roach is oriented to person, place, and time.  Psychiatric:        Mood and Affect: Mood normal.        Behavior: Behavior normal.      UC Treatments / Results  Labs (all labs ordered are listed, but only abnormal results are displayed) Labs Reviewed - No  data to display  EKG   Radiology No results found.  Procedures Procedures (including critical care time)  Medications Ordered in UC Medications - No data to display  Initial Impression / Assessment and Plan / UC Course  I have reviewed the triage vital signs and the nursing notes.  Pertinent labs & imaging results that were available during my care of the patient were reviewed by me and considered in my medical decision making (see chart for details).  Patient presents with an abscess of the left forearm.  On exam, patient has an induration that measures approximately 3 cm in diameter.  The area is firm, nonfluctuant, with no drainage or oozing present.  I&D is not appropriate at this time based on the current presentation.  We will start patient on doxycycline for an additional 7 days as Carolyn Roach has already completed 4 to 5 days of Bactrim and Keflex.  Most likely the previous treatment was not adequate in its duration.  Supportive care recommendations were provided to the patient's detention officer who accompanied her along with strict indications of when to go to the emergency department.  All questions were answered.  Understanding was verbalized.  Final Clinical Impressions(s) / UC Diagnoses   Final diagnoses:  Abscess of left forearm     Discharge Instructions      Take medication as prescribed. Warm compresses to the affected area 3-4 times daily. Clean the area at least twice daily with and antibacterial soap such as Hibiclens. Keep the area covered if it begins to drain. After the area for worsening pain, increased swelling, or the area begins to look like a pimple. Go to the emergency department for the development of fever, chills, generalized fatigue, nausea, vomiting, or if the area of redness spreads into the left upper arm or hand, or there is foul-smelling drainage on the site.      ED Prescriptions     Medication Sig Dispense Auth. Provider   doxycycline  (VIBRA-TABS) 100 MG tablet Take 1 tablet (100 mg total) by mouth 2 (two) times daily for 7 days. 14 tablet [place]ton Viramontes-Warren, Alda Lea, NP      PDMP not reviewed this encounter.   Tish Men, NP 06/16/22 1031

## 2022-06-16 NOTE — Discharge Instructions (Signed)
Take medication as prescribed. Warm compresses to the affected area 3-4 times daily. Clean the area at least twice daily with and antibacterial soap such as Hibiclens. Keep the area covered if it begins to drain. After the area for worsening pain, increased swelling, or the area begins to look like a pimple. Go to the emergency department for the development of fever, chills, generalized fatigue, nausea, vomiting, or if the area of redness spreads into the left upper arm or hand, or there is foul-smelling drainage on the site.
